# Patient Record
Sex: Female | Born: 1950 | Race: White | Hispanic: No | Marital: Married | Smoking: Never smoker
Health system: Southern US, Community
[De-identification: ages and names within clinical notes are randomized; demographics above are authoritative.]

## PROBLEM LIST (undated history)

## (undated) DIAGNOSIS — M278 Other specified diseases of jaws: Secondary | ICD-10-CM

## (undated) DIAGNOSIS — K219 Gastro-esophageal reflux disease without esophagitis: Secondary | ICD-10-CM

## (undated) DIAGNOSIS — R000 Tachycardia, unspecified: Secondary | ICD-10-CM

## (undated) DIAGNOSIS — M810 Age-related osteoporosis without current pathological fracture: Secondary | ICD-10-CM

## (undated) DIAGNOSIS — M19019 Primary osteoarthritis, unspecified shoulder: Secondary | ICD-10-CM

## (undated) DIAGNOSIS — M7542 Impingement syndrome of left shoulder: Secondary | ICD-10-CM

## (undated) DIAGNOSIS — I959 Hypotension, unspecified: Secondary | ICD-10-CM

## (undated) DIAGNOSIS — Z972 Presence of dental prosthetic device (complete) (partial): Secondary | ICD-10-CM

## (undated) HISTORY — DX: Age-related osteoporosis without current pathological fracture: M81.0

## (undated) HISTORY — PX: TOOTH EXTRACTION: SUR596

## (undated) HISTORY — PX: COLONOSCOPY: SHX174

## (undated) HISTORY — PX: ROTATOR CUFF REPAIR: SHX139

---

## 1998-05-14 ENCOUNTER — Emergency Department (HOSPITAL_COMMUNITY): Admission: EM | Admit: 1998-05-14 | Discharge: 1998-05-14 | Payer: Self-pay

## 1998-06-09 ENCOUNTER — Ambulatory Visit (HOSPITAL_COMMUNITY): Admission: RE | Admit: 1998-06-09 | Discharge: 1998-06-09 | Payer: Self-pay | Admitting: Family Medicine

## 2000-05-08 ENCOUNTER — Other Ambulatory Visit: Admission: RE | Admit: 2000-05-08 | Discharge: 2000-05-08 | Payer: Self-pay | Admitting: Obstetrics and Gynecology

## 2001-02-27 ENCOUNTER — Encounter (INDEPENDENT_AMBULATORY_CARE_PROVIDER_SITE_OTHER): Payer: Self-pay | Admitting: Specialist

## 2001-02-27 ENCOUNTER — Ambulatory Visit (HOSPITAL_BASED_OUTPATIENT_CLINIC_OR_DEPARTMENT_OTHER): Admission: RE | Admit: 2001-02-27 | Discharge: 2001-02-27 | Payer: Self-pay | Admitting: Plastic Surgery

## 2001-03-07 ENCOUNTER — Other Ambulatory Visit: Admission: RE | Admit: 2001-03-07 | Discharge: 2001-03-07 | Payer: Self-pay | Admitting: Obstetrics and Gynecology

## 2002-12-11 ENCOUNTER — Other Ambulatory Visit: Admission: RE | Admit: 2002-12-11 | Discharge: 2002-12-11 | Payer: Self-pay | Admitting: *Deleted

## 2004-03-25 ENCOUNTER — Other Ambulatory Visit: Admission: RE | Admit: 2004-03-25 | Discharge: 2004-03-25 | Payer: Self-pay | Admitting: Obstetrics and Gynecology

## 2005-03-31 ENCOUNTER — Other Ambulatory Visit: Admission: RE | Admit: 2005-03-31 | Discharge: 2005-03-31 | Payer: Self-pay | Admitting: Obstetrics and Gynecology

## 2006-04-10 ENCOUNTER — Other Ambulatory Visit: Admission: RE | Admit: 2006-04-10 | Discharge: 2006-04-10 | Payer: Self-pay | Admitting: Obstetrics and Gynecology

## 2010-03-02 ENCOUNTER — Emergency Department (HOSPITAL_COMMUNITY): Admission: EM | Admit: 2010-03-02 | Discharge: 2010-03-02 | Payer: Self-pay | Admitting: Emergency Medicine

## 2011-02-28 LAB — COMPREHENSIVE METABOLIC PANEL
ALT: 21 U/L (ref 0–35)
AST: 20 U/L (ref 0–37)
BUN: 18 mg/dL (ref 6–23)
CO2: 30 mEq/L (ref 19–32)
Chloride: 102 mEq/L (ref 96–112)
GFR calc Af Amer: >60 mL/min (ref >60)
GFR calc non Af Amer: >60 mL/min (ref >60)
Potassium: 3.5 mEq/L (ref 3.5–5.1)
Sodium: 136 mEq/L (ref 135–145)

## 2011-02-28 LAB — CBC
HCT: 41.3 % (ref 36.0–46.0)
MCV: 93.1 fL (ref 78.0–100.0)
RDW: 13.1 % (ref 11.5–15.5)

## 2011-02-28 LAB — DIFFERENTIAL
Basophils Absolute: 0.1 10*3/uL (ref 0.0–0.1)
Basophils Relative: 1 % (ref 0–1)
Eosinophils Absolute: 0.1 10*3/uL (ref 0.0–0.7)
Lymphs Abs: 1.1 10*3/uL (ref 0.7–4.0)
Monocytes Absolute: 0.4 10*3/uL (ref 0.1–1.0)
Monocytes Relative: 7 % (ref 3–12)
Neutro Abs: 3.5 10*3/uL (ref 1.7–7.7)
Neutrophils Relative %: 69 % (ref 43–77)

## 2011-02-28 LAB — POCT PREGNANCY, URINE: Preg Test, Ur: NEGATIVE

## 2011-02-28 LAB — POCT CARDIAC MARKERS: Troponin i, poc: <0.05 ng/mL (ref 0.00–0.09)

## 2011-04-22 NOTE — Op Note (Signed)
[place]  Patient:    Katelyn Bradshaw, Katelyn Bradshaw                     MRN: [id number] Proc. Date: 02/27/01 Adm. Date:  [id number] Attending:  Loura Halt Ii                           Operative Report  PREOPERATIVE DIAGNOSIS:  Recurrent 5 mm dysplastic nevus, left lower eyelid.  POSTOPERATIVE DIAGNOSIS:  Recurrent 5 mm dysplastic nevus, left lower eyelid.  OPERATION PERFORMED:  Excision of recurrent dysplastic nevus, left lower eyelid.  SURGEON:  Alfredia Ferguson, M.D.  ANESTHESIA:  2% Xylocaine with 1:100,000 epinephrine.  INDICATIONS FOR PROCEDURE:  The patient is a 60 year old woman who previously has a dysplastic nevus removed from her left lower eyelid.  Over the last couple of years, there was recurrent pigmentation within the scar of the previous excision site.  Recommendation by the dermatologist is this pigmentation be removed.  The patient understands she will be trading what she has for a permanent potentially unsightly scar.  She understands the risk of ectropion.  In spite of that she wishes to proceed with the surgery.  DESCRIPTION OF PROCEDURE:  Skin markers were placed in elliptical fashion around the lesion with approximately 2 mm margins.  The local anesthesia was infiltrated and the area was prepped and draped in a sterile fashion.  A circular excision of the lesion was first carried out marking the superior and lateral margin.  The circular defect was then undermined for a distance of 3 to 4 mm in all directions.  A temporary suture was placed uniting the edges to ensure that there is no ectropion created.  Once I was certain that the eyelid was not being pulled down, I converted the circle to an ellipse.  The medial and lateral recut margin was submitted.  The ellipse was further undermined. Hemostasis was accomplished using cautery.  The wound was closed by approximating the dermis using interrupted 6-0 Vicryl suture.   Skin was united using interrupted 6-0 nylon suture.  The patient tolerated the procedure well with minimal blood loss.  A light dressing was applied.  No ectropion was present at the conclusion of the procedure.  The patient tolerated the procedure well and was discharged home in satisfactory condition. DD:  02/27/01 TD:  02/27/01 Job: [id number]

## 2012-02-27 ENCOUNTER — Encounter (INDEPENDENT_AMBULATORY_CARE_PROVIDER_SITE_OTHER): Payer: Managed Care, Other (non HMO) | Admitting: Obstetrics and Gynecology

## 2012-02-27 ENCOUNTER — Encounter (INDEPENDENT_AMBULATORY_CARE_PROVIDER_SITE_OTHER): Payer: Managed Care, Other (non HMO)

## 2012-02-27 DIAGNOSIS — N8500 Endometrial hyperplasia, unspecified: Secondary | ICD-10-CM

## 2012-02-27 DIAGNOSIS — N390 Urinary tract infection, site not specified: Secondary | ICD-10-CM

## 2012-02-27 DIAGNOSIS — N949 Unspecified condition associated with female genital organs and menstrual cycle: Secondary | ICD-10-CM

## 2012-02-28 ENCOUNTER — Encounter: Payer: Self-pay | Admitting: Obstetrics and Gynecology

## 2012-03-09 ENCOUNTER — Other Ambulatory Visit: Payer: Self-pay | Admitting: Obstetrics and Gynecology

## 2012-03-09 DIAGNOSIS — R102 Pelvic and perineal pain: Secondary | ICD-10-CM

## 2012-03-09 DIAGNOSIS — N852 Hypertrophy of uterus: Secondary | ICD-10-CM

## 2012-04-27 ENCOUNTER — Ambulatory Visit: Payer: Self-pay | Admitting: Obstetrics and Gynecology

## 2012-05-01 ENCOUNTER — Other Ambulatory Visit: Payer: Managed Care, Other (non HMO)

## 2012-05-25 ENCOUNTER — Encounter: Payer: Self-pay | Admitting: Obstetrics and Gynecology

## 2012-06-01 ENCOUNTER — Ambulatory Visit (INDEPENDENT_AMBULATORY_CARE_PROVIDER_SITE_OTHER): Payer: Managed Care, Other (non HMO)

## 2012-06-01 ENCOUNTER — Encounter: Payer: Self-pay | Admitting: Obstetrics and Gynecology

## 2012-06-01 ENCOUNTER — Ambulatory Visit (INDEPENDENT_AMBULATORY_CARE_PROVIDER_SITE_OTHER): Payer: Managed Care, Other (non HMO) | Admitting: Obstetrics and Gynecology

## 2012-06-01 ENCOUNTER — Other Ambulatory Visit: Payer: Self-pay | Admitting: Obstetrics and Gynecology

## 2012-06-01 VITALS — BP 100/58 | HR 66 | Resp 14 | Ht 65.5 in | Wt 94.0 lb

## 2012-06-01 DIAGNOSIS — N852 Hypertrophy of uterus: Secondary | ICD-10-CM

## 2012-06-01 DIAGNOSIS — R102 Pelvic and perineal pain: Secondary | ICD-10-CM

## 2012-06-01 DIAGNOSIS — E559 Vitamin D deficiency, unspecified: Secondary | ICD-10-CM | POA: Insufficient documentation

## 2012-06-01 DIAGNOSIS — Z01419 Encounter for gynecological examination (general) (routine) without abnormal findings: Secondary | ICD-10-CM

## 2012-06-01 DIAGNOSIS — M810 Age-related osteoporosis without current pathological fracture: Secondary | ICD-10-CM | POA: Insufficient documentation

## 2012-06-01 DIAGNOSIS — Z124 Encounter for screening for malignant neoplasm of cervix: Secondary | ICD-10-CM

## 2012-06-01 NOTE — Progress Notes (Signed)
Regular Periods: no Mammogram: no  Monthly Breast Ex.: yes Exercise: yes  Tetanus < 10 years: no Seatbelts: yes  NI. Bladder Functn.: yes Abuse at home: no  Daily BM's: yes Stressful Work: yes  Healthy Diet: yes Sigmoid-Colonoscopy: 02/2012 "1 polyp Return in 5 yrs."  Calcium: yes Medical problems this year: N/A   LAST PAP:04/2011 "WNL"  Contraception: None  Mammogram:  N/A pt states she stopped getting mammograms pt states [place] knows why.   PCP: Dr.Stoneking  PMH: No changes  FMH: No Changes  Last Bone Scan: per pt years ago and pt states she will not be getting another one.

## 2012-06-01 NOTE — Progress Notes (Signed)
Subjective:    Katelyn Bradshaw is a 61 y.o. female G0P0 who presents for annual exam. The patient previously had a thickened endometrium in March 2013 (0.66 cm).  Endometrial biopsy at that time was benign and patient declined a SHG and wanted to repeat U/S today instead.  Has changed her progesterone cream application site to a more adipose laden area for better absorption.    Current Hormone Regimen: Triest (6:2:2:) 1mg  + Progesterone 15% + DHEA 10 mg + Testosterone 1mg /29mL Cream  patient uses 1/2 mL bid  Last Pap: was normal  2012 Last mammogram: was normal  patient has elected not to continue mammogram Last DEXA scan :  2005  Osteoporosis   Review of Systems Gastrointestinal:No change in bowel habits, no abdominal pain, no rectal bleeding Genitourinary:negative for abnormal vaginal bleeding,  dysuria, frequency, hematuria, nocturia and urinary incontinence    Objective:     BP 100/58  Pulse 66  Resp 14  Ht 5' 5.5" (1.664 m)  Wt 94 lb (42.638 kg)  BMI 15.40 kg/m2  Wt Readings from Last 1 Encounters:  06/01/12 94 lb (42.638 kg)   Body mass index is 15.40 kg/(m^2). General Appearance:  Well nourished in no acute distress HEENT: Grossly normal Neck / Thyroid: Supple, no masses, nodes or enlargement Lungs: Clear to auscultation bilaterally Back: No CVA tenderness Breast Exam: No masses or nodes.No dimpling, nipple retraction or discharge. Cardiovascular: Regular rate and rhythm.  Gastrointestinal: Soft, non-tender, no masses or organomegaly Pelvic Exam: EGBUS-normal, vagina-pink mucosa without lesions, cervix-no tenderness or lesions, uterus-appears normal size, shape and consistency, adnexae-no masses or tenderness Rectovaginal: Normal sphincter tone and  no masses  Lymphatic Exam: Non-palpable nodes in neck, clavicular, axillary, or inguinal regions Skin: No rash or abnormalities Extremities: no clubbing cyanosis or edema Neurologic: Grossly normal Psychiatric: Alert and  oriented x 3   U/S endometrium= 0.365 cm  Assessment:   Routine GYN Exam Normal Endometrial Thickness   Plan:  Saliva kit given to retest hormones in September unless problems develop before then  PAP sent  RTO 1 year or prn   Follow-up:  1 year or prn   Tirza Senteno,ELMIRAPA-C

## 2012-06-04 LAB — PAP IG W/ RFLX HPV ASCU

## 2012-06-21 ENCOUNTER — Ambulatory Visit (INDEPENDENT_AMBULATORY_CARE_PROVIDER_SITE_OTHER): Payer: Managed Care, Other (non HMO) | Admitting: Obstetrics and Gynecology

## 2012-06-21 ENCOUNTER — Encounter: Payer: Self-pay | Admitting: Obstetrics and Gynecology

## 2012-06-21 VITALS — BP 100/70 | Temp 98.0°F | Wt 91.0 lb

## 2012-06-21 DIAGNOSIS — R102 Pelvic and perineal pain: Secondary | ICD-10-CM

## 2012-06-21 DIAGNOSIS — N949 Unspecified condition associated with female genital organs and menstrual cycle: Secondary | ICD-10-CM

## 2012-06-21 LAB — POCT URINALYSIS DIPSTICK
Bilirubin, UA: NEGATIVE
Blood, UA: NEGATIVE
Glucose, UA: NEGATIVE
Ketones, UA: NEGATIVE
Leukocytes, UA: NEGATIVE
Nitrite, UA: NEGATIVE
Protein, UA: NEGATIVE
Spec Grav, UA: 1.015
Urobilinogen, UA: NEGATIVE
pH, UA: 5

## 2012-06-21 NOTE — Progress Notes (Signed)
61 YO with history of pelvic discomfort for years complains of discomfort in groin area that comes and goes and is a burning sensation.  May go for weeks without it then burining  returns and will remain up to 3 days.  Denies any aggravating factors but it is improved with acidophilus douche.  Patient admits to using  Estrace as directed.  Admits to occasional urinary urgency (primarily in a.m. but not related to the burning sensation-has no other urinary symptoms.  Denies changes in bowel movements.  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  O: Abdomen: soft, non-tender       Pelvic: EGBUS-wnl, vagina-normal rugae, cerivix-no lesions, uterus-normal size, adnexae-no masses or tenderness       Groin: no palpable tenderness or masses  A:   Discussed with Dr. Estanislado Pandy, the patient's symptoms, she  advised to refer to  PCP, for non-gyn related discomfort        If non-gyn evaluation does not render a cause of discomfort, diagnostic laparoscopy would be a possible recourse             RTO-AEx or prn

## 2012-06-21 NOTE — Progress Notes (Signed)
Vag. Discharge:no Odor:no Fever:no Irreg.Periods:no Dyspareunia:no Dysuria:no Frequency:no Urgency:yes Hematuria:no Kidney stones:no Constipation:no Diarrhea:no Rectal Bleeding: no Vomiting:no Nausea:no Pregnant:no Fibroids:no Endometriosis:no Hx of Ovarian Cyst:no Hx IUD:no Hx STD-PID:no Appendectomy:no Gall Bladder Dz:no   PT STATES THAT [facility] WILL NO LONGER EXCEPT HER INS. IN 8/13 AND SHE WANT TO DISCUSS

## 2012-07-06 ENCOUNTER — Encounter: Payer: Self-pay | Admitting: Obstetrics and Gynecology

## 2012-07-06 ENCOUNTER — Other Ambulatory Visit: Payer: Self-pay | Admitting: Obstetrics and Gynecology

## 2012-07-06 MED ORDER — ESTRADIOL 0.1 MG/GM VA CREA: TOPICAL_CREAM | VAGINAL | Status: DC

## 2012-07-06 NOTE — Telephone Encounter (Signed)
Pt want estrace.rx Please call [facility] so that pt can have rx

## 2012-07-06 NOTE — Progress Notes (Signed)
Fax received for RF Estrace.  Per EP note 05/2012, no documentation pt is currently using. RF denied.  TC tot pt LM to return call.

## 2012-07-06 NOTE — Telephone Encounter (Signed)
Linda/signed

## 2012-09-04 DIAGNOSIS — M19019 Primary osteoarthritis, unspecified shoulder: Secondary | ICD-10-CM

## 2012-09-04 DIAGNOSIS — M7542 Impingement syndrome of left shoulder: Secondary | ICD-10-CM

## 2012-09-04 DIAGNOSIS — M7540 Impingement syndrome of unspecified shoulder: Secondary | ICD-10-CM | POA: Insufficient documentation

## 2012-09-04 HISTORY — DX: Impingement syndrome of left shoulder: M75.42

## 2012-09-04 HISTORY — DX: Primary osteoarthritis, unspecified shoulder: M19.019

## 2012-09-20 ENCOUNTER — Other Ambulatory Visit: Payer: Self-pay | Admitting: Orthopedic Surgery

## 2012-09-21 ENCOUNTER — Encounter (HOSPITAL_BASED_OUTPATIENT_CLINIC_OR_DEPARTMENT_OTHER): Payer: Self-pay | Admitting: *Deleted

## 2012-09-27 ENCOUNTER — Ambulatory Visit (HOSPITAL_BASED_OUTPATIENT_CLINIC_OR_DEPARTMENT_OTHER): Payer: Managed Care, Other (non HMO) | Admitting: Anesthesiology

## 2012-09-27 ENCOUNTER — Ambulatory Visit (HOSPITAL_BASED_OUTPATIENT_CLINIC_OR_DEPARTMENT_OTHER)
Admission: RE | Admit: 2012-09-27 | Discharge: 2012-09-27 | Disposition: A | Discharge status: Home / Self Care | Payer: Managed Care, Other (non HMO) | Source: Ambulatory Visit | Attending: Orthopedic Surgery | Admitting: Orthopedic Surgery

## 2012-09-27 ENCOUNTER — Encounter (HOSPITAL_BASED_OUTPATIENT_CLINIC_OR_DEPARTMENT_OTHER)
Admission: RE | Disposition: A | Discharge status: Home / Self Care | Payer: Self-pay | Source: Ambulatory Visit | Attending: Orthopedic Surgery

## 2012-09-27 ENCOUNTER — Encounter (HOSPITAL_BASED_OUTPATIENT_CLINIC_OR_DEPARTMENT_OTHER): Payer: Self-pay | Admitting: Anesthesiology

## 2012-09-27 ENCOUNTER — Encounter (HOSPITAL_BASED_OUTPATIENT_CLINIC_OR_DEPARTMENT_OTHER): Payer: Self-pay

## 2012-09-27 DIAGNOSIS — D2110 Benign neoplasm of connective and other soft tissue of unspecified upper limb, including shoulder: Secondary | ICD-10-CM | POA: Insufficient documentation

## 2012-09-27 DIAGNOSIS — M25819 Other specified joint disorders, unspecified shoulder: Secondary | ICD-10-CM | POA: Insufficient documentation

## 2012-09-27 DIAGNOSIS — Z5333 Arthroscopic surgical procedure converted to open procedure: Secondary | ICD-10-CM | POA: Insufficient documentation

## 2012-09-27 DIAGNOSIS — M75120 Complete rotator cuff tear or rupture of unspecified shoulder, not specified as traumatic: Secondary | ICD-10-CM | POA: Insufficient documentation

## 2012-09-27 HISTORY — DX: Other specified diseases of jaws: M27.8

## 2012-09-27 HISTORY — DX: Presence of dental prosthetic device (complete) (partial): Z97.2

## 2012-09-27 HISTORY — DX: Primary osteoarthritis, unspecified shoulder: M19.019

## 2012-09-27 HISTORY — DX: Impingement syndrome of left shoulder: M75.42

## 2012-09-27 LAB — POCT HEMOGLOBIN-HEMACUE: Hemoglobin: 10.7 g/dL — ABNORMAL LOW (ref 12.0–15.0)

## 2012-09-27 SURGERY — SHOULDER ARTHROSCOPY WITH ROTATOR CUFF REPAIR AND SUBACROMIAL DECOMPRESSION
Anesthesia: General | Site: Shoulder | Laterality: Left | Wound class: Clean

## 2012-09-27 MED ORDER — HYDROMORPHONE HCL 2 MG PO TABS: ORAL_TABLET | ORAL | Status: DC

## 2012-09-27 MED ORDER — LACTATED RINGERS IV SOLN
INTRAVENOUS | Status: DC
  Administered 2012-09-27 (×2): via INTRAVENOUS

## 2012-09-27 MED ORDER — FENTANYL CITRATE 0.05 MG/ML IJ SOLN
100.0000 ug | Freq: Once | INTRAMUSCULAR | Status: AC
  Administered 2012-09-27: 100 ug via INTRAVENOUS

## 2012-09-27 MED ORDER — EPHEDRINE SULFATE 50 MG/ML IJ SOLN
INTRAMUSCULAR | Status: DC | PRN
  Administered 2012-09-27: 10 mg via INTRAVENOUS

## 2012-09-27 MED ORDER — DEXAMETHASONE SODIUM PHOSPHATE 4 MG/ML IJ SOLN
INTRAMUSCULAR | Status: DC | PRN
  Administered 2012-09-27: 5 mg via INTRAVENOUS

## 2012-09-27 MED ORDER — CHLORHEXIDINE GLUCONATE 4 % EX LIQD: 60.0000 mL | Freq: Once | CUTANEOUS | Status: DC

## 2012-09-27 MED ORDER — ACETAMINOPHEN 10 MG/ML IV SOLN: 1000.0000 mg | Freq: Once | INTRAVENOUS | Status: DC | PRN

## 2012-09-27 MED ORDER — ONDANSETRON HCL 4 MG/2ML IJ SOLN: 4.0000 mg | Freq: Once | INTRAMUSCULAR | Status: DC | PRN

## 2012-09-27 MED ORDER — VANCOMYCIN HCL IN DEXTROSE 1-5 GM/200ML-% IV SOLN: 1000.0000 mg | INTRAVENOUS | Status: DC

## 2012-09-27 MED ORDER — ONDANSETRON HCL 4 MG/2ML IJ SOLN
INTRAMUSCULAR | Status: DC | PRN
  Administered 2012-09-27: 4 mg via INTRAVENOUS

## 2012-09-27 MED ORDER — MIDAZOLAM HCL 5 MG/5ML IJ SOLN
INTRAMUSCULAR | Status: DC | PRN
  Administered 2012-09-27: 2 mg via INTRAVENOUS

## 2012-09-27 MED ORDER — MIDAZOLAM HCL 5 MG/ML IJ SOLN
2.0000 mg | Freq: Once | INTRAMUSCULAR | Status: AC
  Administered 2012-09-27: 2 mg via INTRAVENOUS

## 2012-09-27 MED ORDER — VANCOMYCIN HCL 1000 MG IV SOLR
750.0000 mg | INTRAVENOUS | Status: AC
  Administered 2012-09-27: 750 mg via INTRAVENOUS

## 2012-09-27 MED ORDER — HYDROMORPHONE HCL PF 1 MG/ML IJ SOLN
0.2500 mg | INTRAMUSCULAR | Status: DC | PRN
  Administered 2012-09-27: 0.25 mg via INTRAVENOUS

## 2012-09-27 MED ORDER — SODIUM CHLORIDE 0.9 % IR SOLN
Status: DC | PRN
  Administered 2012-09-27: 6000 mL

## 2012-09-27 MED ORDER — SUCCINYLCHOLINE CHLORIDE 20 MG/ML IJ SOLN
INTRAMUSCULAR | Status: DC | PRN
  Administered 2012-09-27: 100 mg via INTRAVENOUS

## 2012-09-27 MED ORDER — PROPOFOL 10 MG/ML IV BOLUS
INTRAVENOUS | Status: DC | PRN
  Administered 2012-09-27: 130 mg via INTRAVENOUS

## 2012-09-27 SURGICAL SUPPLY — 79 items
BANDAGE ADHESIVE 1X3 (GAUZE/BANDAGES/DRESSINGS) IMPLANT
BLADE AVERAGE 25X9 (BLADE) IMPLANT
BLADE CUTTER MENIS 5.5 (BLADE) IMPLANT
BLADE SURG 15 STRL LF DISP TIS (BLADE) ×2 IMPLANT
BLADE SURG 15 STRL SS (BLADE) ×2
BUR EGG 3PK/BX (BURR) IMPLANT
BUR OVAL 6.0 (BURR) ×2 IMPLANT
CANISTER OMNI JUG 16 LITER (MISCELLANEOUS) ×4 IMPLANT
CANISTER SUCTION 2500CC (MISCELLANEOUS) IMPLANT
CANNULA SHOULDER 7CM (CANNULA) IMPLANT
CANNULA TWIST IN 8.25X7CM (CANNULA) IMPLANT
CLEANER CAUTERY TIP 5X5 PAD (MISCELLANEOUS) IMPLANT
CLOTH BEACON ORANGE TIMEOUT ST (SAFETY) ×2 IMPLANT
CUTTER MENISCUS  4.2MM (BLADE) ×1
CUTTER MENISCUS 4.2MM (BLADE) ×1 IMPLANT
DECANTER SPIKE VIAL GLASS SM (MISCELLANEOUS) IMPLANT
DRAPE INCISE IOBAN 66X45 STRL (DRAPES) ×2 IMPLANT
DRAPE STERI 35X30 U-POUCH (DRAPES) ×2 IMPLANT
DRAPE SURG 17X23 STRL (DRAPES) ×2 IMPLANT
DRAPE U-SHAPE 47X51 STRL (DRAPES) ×2 IMPLANT
DRAPE U-SHAPE 76X120 STRL (DRAPES) ×4 IMPLANT
DRSG PAD ABDOMINAL 8X10 ST (GAUZE/BANDAGES/DRESSINGS) ×2 IMPLANT
DURAPREP 26ML APPLICATOR (WOUND CARE) ×2 IMPLANT
ELECT REM PT RETURN 9FT ADLT (ELECTROSURGICAL) ×2
ELECTRODE REM PT RTRN 9FT ADLT (ELECTROSURGICAL) ×1 IMPLANT
GLOVE BIO SURGEON STRL SZ 6.5 (GLOVE) ×2 IMPLANT
GLOVE BIO SURGEON STRL SZ7.5 (GLOVE) ×2 IMPLANT
GLOVE BIOGEL M STRL SZ7.5 (GLOVE) ×2 IMPLANT
GLOVE BIOGEL PI IND STRL 7.0 (GLOVE) ×1 IMPLANT
GLOVE BIOGEL PI IND STRL 8 (GLOVE) ×3 IMPLANT
GLOVE BIOGEL PI INDICATOR 7.0 (GLOVE) ×1
GLOVE BIOGEL PI INDICATOR 8 (GLOVE) ×3
GLOVE ORTHO TXT STRL SZ7.5 (GLOVE) ×2 IMPLANT
GOWN BRE IMP PREV XXLGXLNG (GOWN DISPOSABLE) ×4 IMPLANT
GOWN PREVENTION PLUS XLARGE (GOWN DISPOSABLE) ×2 IMPLANT
GOWN STRL REIN 2XL XLG LVL4 (GOWN DISPOSABLE) ×2 IMPLANT
NDL SUT 6 .5 CRC .975X.05 MAYO (NEEDLE) IMPLANT
NEEDLE MAYO TAPER (NEEDLE)
NEEDLE MINI RC 24MM (NEEDLE) IMPLANT
NEEDLE SCORPION (NEEDLE) IMPLANT
PACK ARTHROSCOPY DSU (CUSTOM PROCEDURE TRAY) ×2 IMPLANT
PACK BASIN DAY SURGERY FS (CUSTOM PROCEDURE TRAY) ×2 IMPLANT
PAD CLEANER CAUTERY TIP 5X5 (MISCELLANEOUS)
PASSER SUT SWANSON 36MM LOOP (INSTRUMENTS) IMPLANT
PENCIL BUTTON HOLSTER BLD 10FT (ELECTRODE) ×2 IMPLANT
SLEEVE SCD COMPRESS KNEE MED (MISCELLANEOUS) ×2 IMPLANT
SLING ARM FOAM STRAP LRG (SOFTGOODS) ×2 IMPLANT
SLING ARM FOAM STRAP MED (SOFTGOODS) IMPLANT
SPONGE GAUZE 4X4 12PLY (GAUZE/BANDAGES/DRESSINGS) ×2 IMPLANT
SPONGE LAP 4X18 X RAY DECT (DISPOSABLE) ×4 IMPLANT
STRIP CLOSURE SKIN 1/2X4 (GAUZE/BANDAGES/DRESSINGS) ×2 IMPLANT
SUCTION FRAZIER TIP 10 FR DISP (SUCTIONS) IMPLANT
SUT ETHIBOND 2 OS 4 DA (SUTURE) IMPLANT
SUT ETHILON 4 0 PS 2 18 (SUTURE) IMPLANT
SUT FIBERWIRE #2 38 T-5 BLUE (SUTURE)
SUT FIBERWIRE 3-0 18 TAPR NDL (SUTURE)
SUT PROLENE 1 CT (SUTURE) IMPLANT
SUT PROLENE 3 0 PS 2 (SUTURE) ×2 IMPLANT
SUT TIGER TAPE 7 IN WHITE (SUTURE) IMPLANT
SUT VIC AB 0 CT1 27 (SUTURE)
SUT VIC AB 0 CT1 27XBRD ANBCTR (SUTURE) IMPLANT
SUT VIC AB 0 SH 27 (SUTURE) ×2 IMPLANT
SUT VIC AB 2-0 SH 27 (SUTURE) ×1
SUT VIC AB 2-0 SH 27XBRD (SUTURE) ×1 IMPLANT
SUT VIC AB 3-0 SH 27 (SUTURE)
SUT VIC AB 3-0 SH 27X BRD (SUTURE) IMPLANT
SUT VIC AB 3-0 X1 27 (SUTURE) IMPLANT
SUTURE FIBERWR #2 38 T-5 BLUE (SUTURE) IMPLANT
SUTURE FIBERWR 3-0 18 TAPR NDL (SUTURE) IMPLANT
SYR 3ML 23GX1 SAFETY (SYRINGE) IMPLANT
SYR BULB 3OZ (MISCELLANEOUS) IMPLANT
TAPE FIBER 2MM 7IN #2 BLUE (SUTURE) IMPLANT
TAPE PAPER 3X10 WHT MICROPORE (GAUZE/BANDAGES/DRESSINGS) ×2 IMPLANT
TOWEL OR 17X24 6PK STRL BLUE (TOWEL DISPOSABLE) ×2 IMPLANT
TUBE CONNECTING 20X1/4 (TUBING) ×4 IMPLANT
TUBING ARTHROSCOPY IRRIG 16FT (MISCELLANEOUS) IMPLANT
WAND STAR VAC 90 (SURGICAL WAND) ×2 IMPLANT
WATER STERILE IRR 1000ML POUR (IV SOLUTION) ×2 IMPLANT
YANKAUER SUCT BULB TIP NO VENT (SUCTIONS) IMPLANT

## 2012-09-27 NOTE — Anesthesia Postprocedure Evaluation (Signed)
  Anesthesia Post-op Note  Patient: Katelyn Bradshaw  Procedure(s) Performed: Procedure(s) (LRB) with comments: SHOULDER ARTHROSCOPY WITH ROTATOR CUFF REPAIR AND SUBACROMIAL DECOMPRESSION (Left)  Patient Location: PACU  Anesthesia Type: General and GA combined with regional for post-op pain  Level of Consciousness: awake, alert  and oriented  Airway and Oxygen Therapy: Patient Spontanous Breathing  Post-op Pain: mild  Post-op Assessment: Post-op Vital signs reviewed and Patient's Cardiovascular Status Stable  Post-op Vital Signs: stable  Complications: No apparent anesthesia complications

## 2012-09-27 NOTE — Anesthesia Preprocedure Evaluation (Signed)
Anesthesia Evaluation  Patient identified by MRN, date of birth, ID band Patient awake    Reviewed: Allergy & Precautions, H&P , NPO status , Patient's Chart, lab work & pertinent test results  Airway Mallampati: I      Dental  (+) Dental Advisory Given and Teeth Intact   Pulmonary  breath sounds clear to auscultation        Cardiovascular Rhythm:Regular Rate:Normal     Neuro/Psych    GI/Hepatic   Endo/Other    Renal/GU      Musculoskeletal   Abdominal   Peds  Hematology   Anesthesia Other Findings   Reproductive/Obstetrics                           Anesthesia Physical Anesthesia Plan  ASA: I  Anesthesia Plan: General   Post-op Pain Management:    Induction:   Airway Management Planned: Oral ETT  Additional Equipment:   Intra-op Plan:   Post-operative Plan: Extubation in OR  Informed Consent: I have reviewed the patients History and Physical, chart, labs and discussed the procedure including the risks, benefits and alternatives for the proposed anesthesia with the patient or authorized representative who has indicated his/her understanding and acceptance.   Dental advisory given  Plan Discussed with: CRNA and Surgeon  Anesthesia Plan Comments:         Anesthesia Quick Evaluation

## 2012-09-27 NOTE — Progress Notes (Signed)
Assisted Dr. Joslin with left, ultrasound guided, interscalene  block. Side rails up, monitors on throughout procedure. See vital signs in flow sheet. Tolerated Procedure well. 

## 2012-09-27 NOTE — H&P (Signed)
Katelyn Bradshaw is an 61 y.o. female.   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  Both she and her husband are retired.  She has been experiencing night pain, slight loss of range of motion, marked pain with overhead activity, anterior shoulder tenderness on the left.  She is working part [employer]olic[employer], but feels she likely strained her shoulder doing a workout DVD.  She has been trying to maintain her level of fitness. She has been using Advil, Tylenol, and Aleve.  She has also been applying heat.  She has avoided weightlifting.     Past Medical History  Diagnosis Date  . Osteoporosis   . Impingement syndrome, shoulder, left 09/2012  . 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 Maxillary bone loss     has lower permanent retainer and bonding material over the retainer  . Dental bridge present     lower    Past Surgical History  Procedure Date  . Tooth extraction     History reviewed. No pertinent family history. Social History:  reports that she has never smoked. She has never used smokeless tobacco. She reports that she does not drink alcohol or use illicit drugs.  Allergies:  Allergies  Allergen Reactions  . Doxycycline Hives  . Levaquin (Levofloxacin In D5w) Hives  . Penicillins Hives  . Adhesive (Tape) Other (See Comments)    SKIN IRRITATION  . Codeine Nausea Only    Medications Prior to Admission  Medication Sig Dispense Refill  . clonazePAM (KLONOPIN) 1 MG tablet Take 1 mg by mouth 2 (two) times daily as needed.       Marland Kitchen estradiol (ESTRACE VAGINAL) 0.1 MG/GM vaginal cream 1 g pv with applicator or finger qod x 10 days then twice weekly  42.5 g  11  . Multiple Vitamin (MULTIVITAMIN) tablet Take 1 tablet by mouth daily.      Marland Kitchen zolpidem (AMBIEN) 10 MG tablet Take 1 tablet by mouth as needed.        No results found for this or any previous visit (from the past 48 hour(s)).  No  results found.   Pertinent items are noted in HPI.  Height 5' 5.5" (1.664 m), weight 45.36 kg (100 lb).  General appearance: alert Head: Normocephalic, without obvious abnormality Neck: supple, symmetrical, trachea midline Resp: clear to auscultation bilaterally Cardio: regular rate and rhythm GI: normal findings: bowel sounds normal Extremities: Her shoulder range of motion is full with elevation 175 right equal to left, external rotation 90, internal rotation to at least T-8 right equal to left. She does not have a capsular contracture.  She is profoundly tender on palpation over her left biceps.  There is fluid palpable in the bicipital tendon sheath.  There is pain with cross-torso adduction, pain with rapid abduction.   Plain films of her shoulder demonstrate mild AC arthrosis with some prominence of the anterior acromion consistent with a type II acromion.  MRI at [facility] on 08-16-12 . I carefully examined the films. Katelyn Bradshaw has a prominent anterior lateral acromion and a thickened coracoacromial ligament. There is a small area of increased T2 signal at the anterior supraspinatus. This correlates thoroughly with her physical exam which reveals a marked click crepitation and pop with cross torso adduction in the manner of Hawkins maneuver.  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   DASNOIT,[place] Ramone J 09/27/2012, 9:48 AM     H&P documentation: 09/27/2012  -History and Physical Reviewed  -Patient has been re-examined  -No change in the plan of care  Wyn Forster, MD

## 2012-09-27 NOTE — Brief Op Note (Signed)
09/27/2012  1:47 PM  PATIENT:  Katelyn Bradshaw  61 y.o. female  PRE-OPERATIVE DIAGNOSIS:  LEFT SHOULDER IMPINGEMENT WITH ACRMIOCLAVICULAR ARTHROSIS   POST-OPERATIVE DIAGNOSIS:  IMPINGEMENT WITH ACRMIOCLAVICULAR ARTHROSIS , ROTATOR CUFF TEAR  PROCEDURE:  Procedure(s) (LRB) with comments: SHOULDER ARTHROSCOPY WITH ROTATOR CUFF REPAIR AND SUBACROMIAL DECOMPRESSION (Left)  SURGEON:  Surgeon(s) and Role:    * Wyn Forster., MD - Primary  PHYSICIAN ASSISTANT:   ASSISTANTS:Shalayne Leach Dasnoit,P.A-C   ANESTHESIA:   general  EBL:  Total I/O In: 1200 [I.V.:1200] Out: -   BLOOD ADMINISTERED:none  DRAINS: none   LOCAL MEDICATIONS USED:  ropivicaine plexus block preop  SPECIMEN:  No Specimen  DISPOSITION OF SPECIMEN:  N/A  COUNTS:  YES  TOURNIQUET:  * No tourniquets in log *  DICTATION: .Other Dictation: Dictation Number [id number]  PLAN OF CARE: Discharge to home after PACU  PATIENT DISPOSITION:  PACU - hemodynamically stable.

## 2012-09-27 NOTE — Transfer of Care (Signed)
Immediate Anesthesia Transfer of Care Note  Patient: Katelyn Bradshaw  Procedure(s) Performed: Procedure(s) (LRB) with comments: SHOULDER ARTHROSCOPY WITH ROTATOR CUFF REPAIR AND SUBACROMIAL DECOMPRESSION (Left)  Patient Location: PACU  Anesthesia Type: GA combined with regional for post-op pain  Level of Consciousness: sedated  Airway & Oxygen Therapy: Patient Spontanous Breathing and Patient connected to face mask oxygen  Post-op Assessment: Report given to PACU RN and Post -op Vital signs reviewed and stable  Post vital signs: Reviewed and stable  Complications: No apparent anesthesia complications

## 2012-09-27 NOTE — Anesthesia Procedure Notes (Addendum)
Anesthesia Regional Block:  Interscalene brachial plexus block  Pre-Anesthetic Checklist: ,, timeout performed, Correct Patient, Correct Site, Correct Laterality, Correct Procedure, Correct Position, site marked, Risks and benefits discussed,  Surgical consent,  Pre-op evaluation,  At surgeon's request and post-op pain management  Laterality: Left  Prep: chloraprep       Needles:  Injection technique: Single-shot  Needle Type: Echogenic Stimulator Needle      Needle Gauge: 22 and 22 G    Additional Needles:  Procedures: ultrasound guided and nerve stimulator Interscalene brachial plexus block Narrative:   Performed by: Personally   Additional Notes: 30 cc 0.5% Naropin injected in 5 CC increments  Kipp Brood, MD   Performed by: Gar Gibbon    Procedure Name: Intubation Date/Time: 09/27/2012 12:28 PM Performed by: Gar Gibbon Pre-anesthesia Checklist: Patient identified, Emergency Drugs available, Suction available and Patient being monitored Patient Re-evaluated:Patient Re-evaluated prior to inductionOxygen Delivery Method: Circle System Utilized Preoxygenation: Pre-oxygenation with 100% oxygen Intubation Type: IV induction Ventilation: Mask ventilation without difficulty Laryngoscope Size: Mac and 3 Grade View: Grade III Tube type: Oral Tube size: 7.0 mm Number of attempts: 1 Airway Equipment and Method: stylet and oral airway Placement Confirmation: ETT inserted through vocal cords under direct vision,  positive ETCO2 and breath sounds checked- equal and bilateral Secured at: 21 cm Tube secured with: Tape Dental Injury: Teeth and Oropharynx as per pre-operative assessment

## 2012-09-28 NOTE — Op Note (Signed)
NAME:  Katelyn Bradshaw, Katelyn Bradshaw NO.:  [id number]  MEDICAL RECORD NO.:  [id number]  LOCATION:                                 FACILITY:  PHYSICIAN:  Katy Fitch. Jameir Ake, M.D. DATE OF BIRTH:  18-Oct-1951  DATE OF PROCEDURE:  09/27/2012 DATE OF DISCHARGE:                              OPERATIVE REPORT   PREOPERATIVE DIAGNOSIS:  Chronic right shoulder impingement syndrome with MRI evidence of stage III impingement and small full-thickness rotator cuff tear of anterior supraspinatus with markedly positive rapid abduction sign and Hawkins maneuver.  POSTOPERATIVE DIAGNOSIS:  Very unfavorable anterolateral acromial osteophyte in coracoacromial ligament that was causing an abrasion, full- thickness rotator cuff tear on the bursal side of the supraspinatus.  OPERATION: 1. Diagnostic arthroscopy, right glenohumeral joint with labral     debridement, and limited synovectomy. 2. Subacromial decompression with coracoacromial ligament release and     acromioplasty. 3. Open repair of full-thickness supraspinatus rotator cuff tear     exposing biceps tendon in the anterior greater tuberosity.  OPERATING SURGEON:  Katy Fitch. Mallarie Voorhies, MD  ASSISTANT:  Katelyn Reeks Dasnoit, PA-C  ANESTHESIA:  General by endotracheal technique supplemented by a ropivacaine left plexus block for perioperative comfort.  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  She is extremely small framed.  She is 5 feet 5 inches tall, weighs 100 pounds with a body mass index of 15.  She is very lean.  During the past several years she has had progressive left shoulder pain consistent with impingement and a possible rotator cuff tear.  Her plain films revealed some degenerative change at the [facility] joint consistent with a chronological age, and a small anterolateral acromial hook osteophyte. She was sent for an MRI that was interpreted by  the radiologist to be normal.  However, careful inspection with an orthopedic background revealed that she did indeed have an anterior acromial hook and a defect in her supraspinatus tendon anteriorly.  Katelyn Bradshaw has failed nonoperative measures including extensive therapy, exercise, and rest. We recommended that she proceed with diagnostic arthroscopy anticipating subacromial decompression, and possible distal clavicle resection with repair of rotator cuff as needed.  Preoperatively, she was reminded the potential risks and benefits of surgery.  Questions were invited and answered in detail.  DESCRIPTION OF PROCEDURE:  Katelyn Bradshaw was interviewed in the holding area and her left shoulder marked her proper surgical site identification protocol.  Katelyn Bradshaw provided detailed anesthesia informed consent.  He placed a ropivacaine left shoulder plexus block without complication with ultrasound guidance.  Satisfactory anesthesia of the left arm and forequarter was achieved.  Katelyn Bradshaw was then transferred to room 2 of the [facility] where under Dr. Morley Kos direct supervision general anesthesia by endotracheal technique was induced.  She was carefully positioned in the beach-chair position with aid of torso and head holder designed for shoulder arthroscopy.  A 750 mg of vancomycin was administered slowly over 2 hours as an IV prophylactic antibiotic.  Examination of the shoulder under anesthesia revealed a marked click with rapid abduction and crossed adduction beneath the anterolateral acromion and coracoacromial ligament.  This  correlated with the physical exam findings in the office.  There was no sign of instability, no sign of adhesive capsulitis.  The left upper extremity forequarter was prepped with DuraPrep and draped with impervious arthroscopy drapes.  Following routine surgical time-out, procedure commenced with instrumentation shoulder through an anterior  portal with a switching stick, followed by placing the scope through a standard posterior viewing portal with blunt technique. Diagnostic arthroscopy revealed pristine articular surfaces of the glenoid and humeral head.  The anterior labrum was very diminutive. There was no evidence of a SLAP lesion.  The biceps tendon was normal through the rotator interval and has stout attachment at the superior labrum.  There was some fraying of the capsule and the subscapularis.  A suction shaver was brought in through the anterior portal, used to debride within the joint, complete a limited synovectomy, and to debride some fraying of the labrum.  After photographic documentation of the absence of pathology within the joint, we removed the scope from the glenohumeral joint and placed in the subacromial space.  Diagnostic arthroscopy revealed florid bursitis in subacromial space.  This was debrided with a suction shaver.  Due to Katelyn Bradshaw very small body mass and the absence of any subcutaneous fat and quite diminutive deltoid muscle, it was really quite challenging to position instruments within her subacromial space. We identified the coracoacromial ligament and cleared bursa.  We carefully inspected the rotator cuff from the posterior portal as well as the lateral portal.  She was noted to have a delaminating full- thickness rotator cuff tear, allowing visualization long head of the biceps, and a small portion of greater tuberosity at the anterior supraspinatus.  We palpated the undersurface of the coracoacromial ligament and found a very firm bone spur at its insertion at the anterolateral acromion.  We attempted to remove this arthroscopically, however, due to her diminutive size and challenges on the part of her anesthesia team in maintaining adequate control of her blood pressure, we had challenges visualizing this with excessive bleeding.  Therefore, I elected to perform a small  muscle-splitting incision for hemostasis and direct inspection of the osteophyte and coracoacromial ligament.  The coracoacromial ligament was released and the osteophyte removed with the suction power burr and a hand rasp.  I leveled the acromion to a type 1 morphology and obtained thorough hemostasis.  The subacromial space was carefully palpated and adhesions in the bursa broken with digital dissection.  The defect directly over the anterior greater tuberosity, and long head of the biceps was easily palpated with essentially a donut hole abrasion bursal side full-thickness tear.  We freshened the margins and repaired this side-to-side with mattress sutures of 0 Vicryl.  She had adequate purchase on the greater tuberosity to avoid using an anchor.  The subacromial space was thoroughly lavaged with sterile saline followed by repair of the deltoid split with figure-of-eight sutures of 0 Vicryl.  This was repaired with subcutaneous 2-0 Vicryl and intradermal 3-0 Prolene.  There were no apparent complications.  For aftercare, Ms. Pecha was placed in a sling and will be discharged home with the care of her husband.  We will see her back for followup in our office in 24 hours, at which point in time, we will initiate a therapy program.     Katy Fitch. Darlena Koval, M.D.     RVS/MEDQ  D:  09/27/2012  T:  09/28/2012  Job:  [id number]  cc:   Hal T. Stoneking, M.D.

## 2012-10-03 ENCOUNTER — Encounter (HOSPITAL_BASED_OUTPATIENT_CLINIC_OR_DEPARTMENT_OTHER): Payer: Self-pay

## 2013-09-25 ENCOUNTER — Emergency Department (HOSPITAL_COMMUNITY)
Admission: EM | Admit: 2013-09-25 | Discharge: 2013-09-26 | Disposition: A | Discharge status: Home / Self Care | Payer: Managed Care, Other (non HMO) | Attending: Emergency Medicine | Admitting: Emergency Medicine

## 2013-09-25 ENCOUNTER — Emergency Department (HOSPITAL_COMMUNITY): Payer: Managed Care, Other (non HMO)

## 2013-09-25 ENCOUNTER — Encounter (HOSPITAL_COMMUNITY): Payer: Self-pay | Admitting: Emergency Medicine

## 2013-09-25 DIAGNOSIS — Z8739 Personal history of other diseases of the musculoskeletal system and connective tissue: Secondary | ICD-10-CM | POA: Insufficient documentation

## 2013-09-25 DIAGNOSIS — Z880 Allergy status to penicillin: Secondary | ICD-10-CM | POA: Insufficient documentation

## 2013-09-25 DIAGNOSIS — R000 Tachycardia, unspecified: Secondary | ICD-10-CM | POA: Insufficient documentation

## 2013-09-25 DIAGNOSIS — Z8719 Personal history of other diseases of the digestive system: Secondary | ICD-10-CM | POA: Insufficient documentation

## 2013-09-25 LAB — BASIC METABOLIC PANEL
BUN: 17 mg/dL (ref 6–23)
Chloride: 104 mEq/L (ref 96–112)
Creatinine, Ser: 0.66 mg/dL (ref 0.50–1.10)
GFR calc non Af Amer: >90 mL/min (ref >90)
Glucose, Bld: 88 mg/dL (ref 70–99)
Potassium: 3.9 mEq/L (ref 3.5–5.1)

## 2013-09-25 LAB — CBC WITH DIFFERENTIAL/PLATELET
HCT: 41.9 % (ref 36.0–46.0)
Hemoglobin: 14.2 g/dL (ref 12.0–15.0)
MCHC: 33.9 g/dL (ref 30.0–36.0)
MCV: 94.2 fL (ref 78.0–100.0)

## 2013-09-25 MED ORDER — SODIUM CHLORIDE 0.9 % IV BOLUS (SEPSIS)
1000.0000 mL | Freq: Once | INTRAVENOUS | Status: AC
  Administered 2013-09-25: 1000 mL via INTRAVENOUS

## 2013-09-25 MED ORDER — ASPIRIN 325 MG PO TABS
325.0000 mg | ORAL_TABLET | Freq: Once | ORAL | Status: AC
  Administered 2013-09-25: 325 mg via ORAL
  Filled 2013-09-25: qty 1

## 2013-09-25 NOTE — ED Notes (Signed)
Per EMS< pt reports some palpitations and increased HR starting around 1615 tonight.  Pt went to UC and was noted to have "Anterior Infacrt."  Pt has no complaints of pain or nausea at this time

## 2013-09-25 NOTE — ED Notes (Signed)
Pt states that she is not having any chest pain, but has had some palpitations.  Pt states that she thinks that palpitations feeling is passing, but is not sure.

## 2013-09-26 ENCOUNTER — Emergency Department (HOSPITAL_COMMUNITY): Payer: Managed Care, Other (non HMO)

## 2013-09-26 ENCOUNTER — Encounter (HOSPITAL_COMMUNITY): Payer: Self-pay | Admitting: Radiology

## 2013-09-26 MED ORDER — IOHEXOL 350 MG/ML SOLN
80.0000 mL | Freq: Once | INTRAVENOUS | Status: AC | PRN
  Administered 2013-09-26: 65 mL via INTRAVENOUS

## 2013-09-26 NOTE — ED Provider Notes (Signed)
CSN: [id number]     Arrival date & time 09/25/13  2158 History   First MD Initiated Contact with Patient 09/25/13 2218     Chief Complaint  Patient presents with  . Palpitations   (Consider location/radiation/quality/duration/timing/severity/associated sxs/prior Treatment) Patient is a 62 y.o. female presenting with palpitations. The history is provided by the patient. No language interpreter was used.  Palpitations Palpitations quality:  Regular Onset quality:  Sudden Duration: began at 4:15 while going up stairs and lifting heavy objects. Timing:  Constant Progression:  Improving Chronicity:  New Context: caffeine and exercise   Context: not anxiety, not nicotine and not stimulant use   Relieved by:  Bed rest Worsened by:  Nothing tried Associated symptoms: no back pain, no chest pain, no chest pressure, no cough, no diaphoresis, no dizziness, no leg pain, no lower extremity edema, no nausea, no near-syncope, no shortness of breath and no vomiting   Risk factors: no hx of atrial fibrillation, no hx of DVT, no hx of PE and no hx of thyroid disease     Past Medical History  Diagnosis Date  . Osteoporosis   . Impingement syndrome, shoulder, left 09/2012  . 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 Maxillary bone loss     has lower permanent retainer and bonding material over the retainer  . Dental bridge present     lower   Past Surgical History  Procedure Laterality Date  . Tooth extraction     No family history on file. History  Substance Use Topics  . Smoking status: Never Smoker   . Smokeless tobacco: Never Used  . Alcohol Use: No   OB History   Grav Para Term Preterm Abortions TAB SAB Ect Mult Living   0              Review of Systems  Constitutional: Negative for fever, diaphoresis, activity change and fatigue.  Respiratory: Negative for cough, chest tightness and shortness of breath.   Cardiovascular: Positive for palpitations. Negative for chest pain, leg  swelling and near-syncope.  Gastrointestinal: Negative for nausea, vomiting, abdominal pain and abdominal distention.  Musculoskeletal: Negative for back pain.  Neurological: Negative for dizziness, syncope and light-headedness.  All other systems reviewed and are negative.    Allergies  Doxycycline; Levaquin; Penicillins; Adhesive; and Codeine  Home Medications   Current Outpatient Rx  Name  Route  Sig  Dispense  Refill  . clonazePAM (KLONOPIN) 1 MG tablet   Oral   Take 1 mg by mouth 2 (two) times daily as needed for anxiety.          Marland Kitchen zolpidem (AMBIEN) 10 MG tablet   Oral   Take 1 tablet by mouth at bedtime as needed for sleep.           BP 112/65  Pulse 74  Temp(Src) 98.2 F (36.8 C) (Oral)  Resp 16  SpO2 100% Physical Exam  Vitals reviewed. Constitutional: She is oriented to person, place, and time. She appears well-developed and well-nourished. No distress.  HENT:  Head: Normocephalic.  Mouth/Throat: Oropharynx is clear and moist.  Eyes: Conjunctivae are normal.  Neck: Neck supple. No tracheal deviation present. No thyromegaly present.  Cardiovascular: Regular rhythm, normal heart sounds and intact distal pulses.  Tachycardia present.   Pulmonary/Chest: Effort normal and breath sounds normal.  Abdominal: Soft. Bowel sounds are normal. She exhibits no distension. There is no tenderness.  Musculoskeletal: She exhibits no edema and no tenderness.  Neurological: She is alert and oriented  to person, place, and time.  Skin: Skin is warm and dry.  Psychiatric: She has a normal mood and affect. Her behavior is normal. Judgment and thought content normal.    ED Course  Procedures (including critical care time) Labs Review Labs Reviewed  CBC WITH DIFFERENTIAL  BASIC METABOLIC PANEL  POCT I-STAT TROPONIN I   Imaging Review Ct Angio Chest Pe W/cm &/or Wo Cm  09/26/2013   CLINICAL DATA:  Short of breath and tachycardia  EXAM: CT ANGIOGRAPHY CHEST WITH CONTRAST   TECHNIQUE: Multidetector CT imaging of the chest was performed using the standard protocol during bolus administration of intravenous contrast. Multiplanar CT image reconstructions including MIPs were obtained to evaluate the vascular anatomy.  CONTRAST:  65mL OMNIPAQUE IOHEXOL 350 MG/ML SOLN  COMPARISON:  09/25/2013  FINDINGS: Negative for pulmonary embolism. No significant opacification of the thoracic aorta which does not appear to be dilated.  Mild to moderate pericardial effusion is present.  The lungs are clear without infiltrate or effusion. No mass or adenopathy.  Review of the MIP images confirms the above findings.  IMPRESSION: Negative for pulmonary embolism.  Pericardial effusion.  The lungs are clear.   Electronically Signed   By: Marlan Palau M.D.   On: 09/26/2013 00:42   Dg Chest Portable 1 View  09/25/2013   CLINICAL DATA:  Palpitations and short of breath  EXAM: PORTABLE CHEST - 1 VIEW  COMPARISON:  03/02/2010.  FINDINGS: COPD with hyperinflation of the lungs. Negative for pneumonia or heart failure. No mass or effusion.  IMPRESSION: COPD. No acute cardiopulmonary abnormality.   Electronically Signed   By: Marlan Palau M.D.   On: 09/25/2013 22:51    EKG Interpretation   None      Date: 09/26/2013  Rate: 114  Rhythm: sinus tachycardia  QRS Axis: normal  Intervals: PR prolonged  ST/T Wave abnormalities: ST elevations inferiorly  Conduction Disutrbances:first-degree A-V block   Narrative Interpretation: sinus tachycardia, ST elevation in II,III, AVF, prolonged PR  Old EKG Reviewed: none available    MDM   1. Tachycardia    62 y/o female with no significant PMH presenting with palpitations. Symptoms began at 4:15 while she was lifting and going up and down stairs. No chest pain, SOB, dizziness, n/v. No recent illness. No prior history of palpitations, irregular heart beat, or cardiac disease. No history or risk factors for PE. Seen [facility] with EKG showing ST elevations  concerning for inferior MI. AF, tachycardic on arrival. Regular rhythm. Lungs clear. No JVD, LE edema. No LE swelling, tenderness, erythema. Other vitals stable. Well appearing. Evaluated by Cardiology who did not feel that this was a STEMI. Fluid bolus given with resolution of tachycardia. CT PE obtained which did not show PE. Troponin negative. Repeat EKG showed resolution of ST changes and NSR. Unclear etiology of palpitations. She reports that she has consumed more caffeine than usual and had been working out harder which could explain. Does not appear to be consistent with arrythmia, ACS, PE. Also doubt thyroid disorder, toxic ingestion, sepsis. As symptoms have now resolved, patient appropriate for discharge with PCP f/u. Return precautions discussed and patient voiced understanding of instructions.   Labs and imaging reviewed in my medical decision making if ordered. Patient discussed with my attending, Dr. Micheline Maze.     Abagail Kitchens, MD 09/26/13 2147

## 2013-09-29 NOTE — ED Provider Notes (Signed)
Medical screening examination/treatment/procedure(s) were conducted as a shared visit with resident-physician practitioner(s) and myself.  I personally evaluated the patient during the encounter.  Pt is a 62 y.o. female with pmhx as above presenting with sudden onset palpitations while walking up stairs.  Pt found to have slight STE in inferior leads w/ tachycardia. Cardiology present upon arrival, did not feel this represented acute STEMI, believes the changes are rate dependant and IVF should be given. She denied CP, SOB, lightheadedness, n/v, diaphoresis.  No risk factors for PE.  IVF given, and STE resolved as rate normalized. Pt d/c home w/ return precautions for returning or new symptoms. I agree w/ Dr. Lubertha Basque EKG assessment.    Shanna Cisco, MD 09/29/13 [phone]

## 2013-10-09 ENCOUNTER — Encounter: Payer: Self-pay | Admitting: Interventional Cardiology

## 2013-10-09 DIAGNOSIS — Z972 Presence of dental prosthetic device (complete) (partial): Secondary | ICD-10-CM | POA: Insufficient documentation

## 2013-10-09 DIAGNOSIS — M278 Other specified diseases of jaws: Secondary | ICD-10-CM | POA: Insufficient documentation

## 2013-10-11 ENCOUNTER — Ambulatory Visit (INDEPENDENT_AMBULATORY_CARE_PROVIDER_SITE_OTHER): Payer: Managed Care, Other (non HMO) | Admitting: Interventional Cardiology

## 2013-10-11 ENCOUNTER — Encounter: Payer: Self-pay | Admitting: Interventional Cardiology

## 2013-10-11 VITALS — BP 105/70 | HR 54 | Ht 65.0 in | Wt 95.0 lb

## 2013-10-11 DIAGNOSIS — I4892 Unspecified atrial flutter: Secondary | ICD-10-CM

## 2013-10-11 NOTE — Patient Instructions (Signed)
Your physician recommends that you continue on your current medications as directed. Please refer to the Current Medication list given to you today.  Your physician has requested that you have an echocardiogram. Echocardiography is a painless test that uses sound waves to create images of your heart. It provides your doctor with information about the size and shape of your heart and how well your heart's chambers and valves are working. This procedure takes approximately one hour. There are no restrictions for this procedure.   Your physician has recommended that you wear an event monitor. Event monitors are medical devices that record the heart's electrical activity. Doctors most often us these monitors to diagnose arrhythmias. Arrhythmias are problems with the speed or rhythm of the heartbeat. The monitor is a small, portable device. You can wear one while you do your normal daily activities. This is usually used to diagnose what is causing palpitations/syncope (passing out).  Your physician recommends that you schedule a follow-up appointment in: 4-6 weeks   

## 2013-10-11 NOTE — Progress Notes (Signed)
Patient ID: Katelyn Bradshaw, female   DOB: 1951-03-18, 62 y.o.   MRN: [id number]   Date: 10/11/2013 ID: Katelyn Bradshaw, DOB 25-Dec-1950, MRN [id number] PCP: Ginette Otto, MD  Reason: Atrial flutter  ASSESSMENT;  1. Atrial flutter with 2:1 AV conduction and spontaneous conversion on 09/25/2013. Long-time history of palpitations  PLAN:  1. 2-D Doppler echocardiogram 2. Thirty-day continuous ambulatory monitor 3. 4-6 week followup to discuss data and decide upon a treatment strategy 4. Recommended 81 mg of aspirin daily but she does not feel she could tolerate this from a GI standpoint 5. We had a prolonged conversation concerning the Genesis, and management of atrial flutter. We discussed medical therapy and ablation therapy. We discussed indications for anticoagulation. Her stroke risk is low with a CHADS of less than 1 .   SUBJECTIVE: Katelyn Bradshaw is a 62 y.o. female who is referred by Merlene Laughter for evaluation of tachycardia. The patient was seen in the walk-in clinic followed by a trip to the [facility] Emergency room where an intravenous catheter was started. No specific therapy was given. There was spontaneous conversion to normal sinus rhythm. She has never before had a sustained tachycardia. She feels that there have been palpitations her entire life. During the tachycardia she denied chest pain, dyspnea, lightheadedness, and syncope.   Allergies  Allergen Reactions  . Doxycycline Hives  . Levaquin [Levofloxacin In D5w] Hives  . Penicillins Hives  . Adhesive [Tape] Other (See Comments)    SKIN IRRITATION  . Codeine Nausea Only    Current Outpatient Prescriptions on File Prior to Visit  Medication Sig Dispense Refill  . clonazePAM (KLONOPIN) 1 MG tablet Take 1 mg by mouth 2 (two) times daily as needed for anxiety.       Marland Kitchen zolpidem (AMBIEN) 10 MG tablet Take 1 tablet by mouth at bedtime as needed for sleep.        No current facility-administered medications on  file prior to visit.    Past Medical History  Diagnosis Date  . Osteoporosis   . Impingement syndrome, shoulder, left 09/2012  . 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 Maxillary bone loss     has lower permanent retainer and bonding material over the retainer  . Dental bridge present     lower    Past Surgical History  Procedure Laterality Date  . Tooth extraction      History   Social History  . Marital Status: Married    Spouse Name: N/A    Number of Children: N/A  . Years of Education: N/A   Occupational History  . Not on file.   Social History Main Topics  . Smoking status: Never Smoker   . Smokeless tobacco: Never Used  . Alcohol Use: No  . Drug Use: No  . Sexual Activity: Yes    Birth Control/ Protection: Post-menopausal   Other Topics Concern  . Not on file   Social History Narrative  . No narrative on file    History reviewed. No pertinent family history.  ROS: Weight loss. She denies thyroid disease. Denies gastrointestinal disorder. No dysphagia. She denies neurological diseases and has never had a TIA or migraine.. Other systems negative for complaints.  OBJECTIVE: BP 105/70  Pulse 54  Ht 5\' 5"  (1.651 m)  Wt 95 lb (43.092 kg)  BMI 15.81 kg/m2,  General: No acute distress, slender and near malnourished appearing HEENT: normal with no pallor or jaundice Neck: JVD flat. Carotids absent Chest: Clear Cardiac: Murmur: None. Gallop:  None. Rhythm: Regular. Other: Normal Abdomen: Bruit: None. Pulsation: Pulsatile aorta but not widened Extremities: Edema: None. Pulses: 1+ at all locations Neuro: Normal Psych: Anxious  ECG: Normal with exception of sinus bradycardia and incomplete right bundle

## 2013-10-17 ENCOUNTER — Encounter: Payer: Self-pay | Admitting: *Deleted

## 2013-10-17 ENCOUNTER — Encounter: Payer: Self-pay | Admitting: Cardiology

## 2013-10-17 ENCOUNTER — Ambulatory Visit (HOSPITAL_COMMUNITY): Payer: Managed Care, Other (non HMO) | Attending: Cardiology

## 2013-10-17 ENCOUNTER — Encounter (INDEPENDENT_AMBULATORY_CARE_PROVIDER_SITE_OTHER): Payer: Managed Care, Other (non HMO)

## 2013-10-17 DIAGNOSIS — I079 Rheumatic tricuspid valve disease, unspecified: Secondary | ICD-10-CM | POA: Insufficient documentation

## 2013-10-17 DIAGNOSIS — R002 Palpitations: Secondary | ICD-10-CM | POA: Insufficient documentation

## 2013-10-17 DIAGNOSIS — I319 Disease of pericardium, unspecified: Secondary | ICD-10-CM | POA: Insufficient documentation

## 2013-10-17 DIAGNOSIS — I4892 Unspecified atrial flutter: Secondary | ICD-10-CM

## 2013-10-17 DIAGNOSIS — R000 Tachycardia, unspecified: Secondary | ICD-10-CM | POA: Insufficient documentation

## 2013-10-17 NOTE — Progress Notes (Signed)
Echocardiogram performed.  

## 2013-10-17 NOTE — Progress Notes (Signed)
Patient ID: Katelyn Bradshaw, female   DOB: July 23, 1951, 62 y.o.   MRN: [id number] [employer] 30 day cardiac event monitor applied to patient.

## 2013-10-18 ENCOUNTER — Telehealth: Payer: Self-pay | Admitting: Interventional Cardiology

## 2013-10-18 NOTE — Telephone Encounter (Signed)
Advised that her Echo has not been read by Dr. Katrinka Blazing yet.  He will be back in office on Mon 11/17.  Advised once he has read Echo someone would be calling her the results.

## 2013-10-18 NOTE — Telephone Encounter (Signed)
Follow Up  Pt calling for ECHO results// please call

## 2013-10-21 ENCOUNTER — Telehealth: Payer: Self-pay | Admitting: Interventional Cardiology

## 2013-10-21 DIAGNOSIS — I313 Pericardial effusion (noninflammatory): Secondary | ICD-10-CM

## 2013-10-21 NOTE — Telephone Encounter (Signed)
New message ° ° ° ° ° °Want echo results °

## 2013-10-21 NOTE — Telephone Encounter (Signed)
Message copied by Jarvis Newcomer on Mon Oct 21, 2013  5:40 PM ------      Message from: Verdis Prime      Created: Mon Oct 21, 2013  8:46 AM       Overall, looks pretty good except small pericardial effusion. Uncertain significance but may be playing a role in etiology of effusion. Should repeat limited echo in 1 month to assess for change. ------

## 2013-10-21 NOTE — Telephone Encounter (Signed)
pt given results of echo.Overall, looks pretty good except small pericardial effusion. Uncertain significance but may be playing a role in etiology of effusion. Should repeat limited echo in 1 month to assess for change.adv that scheduling will call to schedule limited echo.pt verbalized understanding

## 2013-11-19 ENCOUNTER — Ambulatory Visit (INDEPENDENT_AMBULATORY_CARE_PROVIDER_SITE_OTHER): Payer: Managed Care, Other (non HMO) | Admitting: Interventional Cardiology

## 2013-11-19 ENCOUNTER — Encounter: Payer: Self-pay | Admitting: Interventional Cardiology

## 2013-11-19 ENCOUNTER — Ambulatory Visit (HOSPITAL_COMMUNITY): Payer: Managed Care, Other (non HMO) | Attending: Interventional Cardiology | Admitting: Radiology

## 2013-11-19 VITALS — BP 120/76 | HR 73 | Ht 65.0 in | Wt 95.0 lb

## 2013-11-19 DIAGNOSIS — I319 Disease of pericardium, unspecified: Secondary | ICD-10-CM

## 2013-11-19 DIAGNOSIS — I313 Pericardial effusion (noninflammatory): Secondary | ICD-10-CM

## 2013-11-19 DIAGNOSIS — I3139 Other pericardial effusion (noninflammatory): Secondary | ICD-10-CM | POA: Insufficient documentation

## 2013-11-19 DIAGNOSIS — I4892 Unspecified atrial flutter: Secondary | ICD-10-CM

## 2013-11-19 NOTE — Patient Instructions (Signed)
Your physician recommends that you continue on your current medications as directed. Please refer to the Current Medication list given to you today.  Your physician wants you to follow-up in: 6 months You will receive a reminder letter in the mail two months in advance. If you don't receive a letter, please call our office to schedule the follow-up appointment.  We will call the the results of your cardiac monitor, and move up your appt if necessary.

## 2013-11-19 NOTE — Progress Notes (Signed)
Limited Echocardiogram performed.  

## 2013-11-19 NOTE — Progress Notes (Signed)
Patient ID: Katelyn Bradshaw, female   DOB: 1951/05/12, 62 y.o.   MRN: [id number]    [address] [place], [place]  [address] Phone: [phone] Fax:  [phone]  Date:  11/19/2013   ID:  Katelyn Bradshaw, DOB Feb 15, 1951, MRN [id number]  PCP:  Ginette Otto, MD   ASSESSMENT:  1. Atrial flutter, with spontaneous conversion and no clinical recurrence over the past 6-7 weeks. 2. Small posterior and RV free wall pericardial effusion with no clinical evidence of change over 6 weeks or evidence of tamponade   PLAN:  1. continue aspirin 81 mg per day 2. Review thirty-day continuous monitor and report results and any changes in treatment strategy 3. Six-month clinical followup   SUBJECTIVE: Katelyn Bradshaw is a 62 y.o. female who has felt premature beats frequently since the last office visit. She wore monitor for 30 days. I don't have the results available. Today's echo was reviewed and the posterior and RV free wall per Baldo Ash effusion is present but has not changed when compared to the prior study. She denies cardiopulmonary complaints other than palpitations.   Wt Readings from Last 3 Encounters:  11/19/13 95 lb (43.092 kg)  10/11/13 95 lb (43.092 kg)  09/27/12 91 lb 2 oz (41.334 kg)     Past Medical History  Diagnosis Date  . Osteoporosis   . Impingement syndrome, shoulder, left 09/2012  . 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 Maxillary bone loss     has lower permanent retainer and bonding material over the retainer  . Dental bridge present     lower    Current Outpatient Prescriptions  Medication Sig Dispense Refill  . aspirin 81 MG tablet Take 81 mg by mouth daily.      . clonazePAM (KLONOPIN) 1 MG tablet Take 1 mg by mouth 2 (two) times daily as needed for anxiety.       Marland Kitchen zolpidem (AMBIEN) 10 MG tablet Take 1 tablet by mouth at bedtime as needed for sleep.        No current facility-administered medications for this visit.    Allergies:      Allergies  Allergen Reactions  . Doxycycline Hives  . Levaquin [Levofloxacin In D5w] Hives  . Penicillins Hives  . Adhesive [Tape] Other (See Comments)    SKIN IRRITATION  . Codeine Nausea Only    Social History:  The patient  reports that she has never smoked. She has never used smokeless tobacco. She reports that she does not drink alcohol or use illicit drugs.   ROS:  Please see the history of present illness.   Denies chills and fever.   All other systems reviewed and negative.   OBJECTIVE: VS:  BP 120/76  Pulse 73  Ht 5\' 5"  (1.651 m)  Wt 95 lb (43.092 kg)  BMI 15.81 kg/m2 Well nourished, well developed, in no acute distress, frail appearing  HEENT: normal Neck: JVD flat. Carotid bruit absent  Cardiac:  normal S1, S2; RRR; no murmur. No paracardial rub is noted  Lungs:  clear to auscultation bilaterally, no wheezing, rhonchi or rales Abd: soft, nontender, no hepatomegaly Ext: Edema absent. Pulses 2+  Skin: warm and dry Neuro:  CNs 2-12 intact, no focal abnormalities noted  EKG:  Not repeated       Signed, Darci Needle III, MD 11/19/2013 10:54 AM

## 2013-11-25 ENCOUNTER — Telehealth: Payer: Self-pay | Admitting: Interventional Cardiology

## 2013-11-25 NOTE — Telephone Encounter (Signed)
pt given results of monitor. 1)fluttering correlates with PAC's 2)No A-Flutter 3)?brief A-Fib with palpitations. no changes at this time pt verbalized understanding.

## 2013-11-25 NOTE — Telephone Encounter (Signed)
New Message  Pt calling for cardiac monitor results..// Please call

## 2014-04-09 DIAGNOSIS — F411 Generalized anxiety disorder: Secondary | ICD-10-CM | POA: Diagnosis not present

## 2014-06-05 DIAGNOSIS — Z124 Encounter for screening for malignant neoplasm of cervix: Secondary | ICD-10-CM | POA: Diagnosis not present

## 2014-06-05 DIAGNOSIS — N390 Urinary tract infection, site not specified: Secondary | ICD-10-CM | POA: Diagnosis not present

## 2014-06-12 ENCOUNTER — Encounter: Payer: Self-pay | Admitting: Interventional Cardiology

## 2014-06-12 ENCOUNTER — Ambulatory Visit (INDEPENDENT_AMBULATORY_CARE_PROVIDER_SITE_OTHER): Payer: Medicare Other | Admitting: Interventional Cardiology

## 2014-06-12 VITALS — BP 102/60 | HR 71 | Ht 65.0 in | Wt 88.0 lb

## 2014-06-12 DIAGNOSIS — I483 Typical atrial flutter: Secondary | ICD-10-CM

## 2014-06-12 DIAGNOSIS — I4892 Unspecified atrial flutter: Secondary | ICD-10-CM | POA: Diagnosis not present

## 2014-06-12 DIAGNOSIS — I319 Disease of pericardium, unspecified: Secondary | ICD-10-CM

## 2014-06-12 DIAGNOSIS — I3139 Other pericardial effusion (noninflammatory): Secondary | ICD-10-CM

## 2014-06-12 DIAGNOSIS — I313 Pericardial effusion (noninflammatory): Secondary | ICD-10-CM

## 2014-06-12 NOTE — Progress Notes (Signed)
Patient ID: Katelyn Bradshaw, female   DOB: 09-Nov-1951, 63 y.o.   MRN: [id number]    [address], [place],   [address] Phone: [phone] Fax:  [phone]  Date:  06/12/2014   ID:  Taleyah Hillman, DOB 1951-05-27, MRN [id number]  PCP:  Mathews Argyle, MD   ASSESSMENT:  1. Ambulatory monitoring demonstrated PACs but no recurrence of atrial flutter or fibrillation. 2. Frequent PACs  PLAN:  1. Clinical observation. Anticoagulation is not indicated. Clinical followup as needed  2. 81 mg of aspirin daily as recommended but the patient refuses.   SUBJECTIVE: Katelyn Bradshaw is a 63 y.o. female no significant symptoms or prolonged arrhythmia in the elbow since being evaluated in January. A continuous ambulatory monitor for 30 days and not refill atrial flutter or fibrillation. She denies neurological complaints. She has not had syncope.   Wt Readings from Last 3 Encounters:  06/12/14 88 lb (39.917 kg)  11/19/13 95 lb (43.092 kg)  10/11/13 95 lb (43.092 kg)     Past Medical History  Diagnosis Date  . Osteoporosis   . Impingement syndrome, shoulder, left 09/2012  . 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 Maxillary bone loss     has lower permanent retainer and bonding material over the retainer  . Dental bridge present     lower    Current Outpatient Prescriptions  Medication Sig Dispense Refill  . clonazePAM (KLONOPIN) 1 MG tablet Take 1 mg by mouth 2 (two) times daily as needed for anxiety.       Marland Kitchen zolpidem (AMBIEN) 10 MG tablet Take 1 tablet by mouth at bedtime as needed for sleep.       Marland Kitchen aspirin 81 MG tablet Take 81 mg by mouth daily.       No current facility-administered medications for this visit.    Allergies:    Allergies  Allergen Reactions  . Doxycycline Hives  . Levaquin [Levofloxacin In D5w] Hives  . Penicillins Hives  . Adhesive [Tape] Other (See Comments)    SKIN IRRITATION  . Codeine Nausea Only    Social History:  The  patient  reports that she has never smoked. She has never used smokeless tobacco. She reports that she does not drink alcohol or use illicit drugs.   ROS:  Please see the history of present illness.   Says aspirin hurts her stomach.   All other systems reviewed and negative.   OBJECTIVE: VS:  BP 102/60  Pulse 71  Ht 5\' 5"  (1.651 m)  Wt 88 lb (39.917 kg)  BMI 14.64 kg/m2  SpO2 98% Well nourished, well developed, in no acute distress, very slender and almost appears undernourished HEENT: normal Neck: JVD flat. Carotid bruit absent  Cardiac:  normal S1, S2; RRR; no murmur Lungs:  clear to auscultation bilaterally, no wheezing, rhonchi or rales Abd: soft, nontender, no hepatomegaly Ext: Edema absent. Pulses 1-2+ bilateral Skin: warm and dry Neuro:  CNs 2-12 intact, no focal abnormalities noted  EKG:  Not repeated       Signed, Illene Labrador III, MD 06/12/2014 10:07 AM

## 2014-06-12 NOTE — Patient Instructions (Signed)
Your physician recommends that you continue on your current medications as directed. Please refer to the Current Medication list given to you today.   Your physician recommends that you schedule a follow-up appointment as needed  

## 2014-08-07 DIAGNOSIS — D485 Neoplasm of uncertain behavior of skin: Secondary | ICD-10-CM | POA: Diagnosis not present

## 2014-08-07 DIAGNOSIS — L03039 Cellulitis of unspecified toe: Secondary | ICD-10-CM | POA: Diagnosis not present

## 2014-08-07 DIAGNOSIS — D2330 Other benign neoplasm of skin of unspecified part of face: Secondary | ICD-10-CM | POA: Diagnosis not present

## 2014-08-07 DIAGNOSIS — D239 Other benign neoplasm of skin, unspecified: Secondary | ICD-10-CM | POA: Diagnosis not present

## 2014-08-07 DIAGNOSIS — D1801 Hemangioma of skin and subcutaneous tissue: Secondary | ICD-10-CM | POA: Diagnosis not present

## 2014-08-07 DIAGNOSIS — L03019 Cellulitis of unspecified finger: Secondary | ICD-10-CM | POA: Diagnosis not present

## 2014-08-26 DIAGNOSIS — R21 Rash and other nonspecific skin eruption: Secondary | ICD-10-CM | POA: Diagnosis not present

## 2014-08-27 DIAGNOSIS — H2510 Age-related nuclear cataract, unspecified eye: Secondary | ICD-10-CM | POA: Diagnosis not present

## 2014-10-27 DIAGNOSIS — M810 Age-related osteoporosis without current pathological fracture: Secondary | ICD-10-CM | POA: Diagnosis not present

## 2014-10-27 DIAGNOSIS — E46 Unspecified protein-calorie malnutrition: Secondary | ICD-10-CM | POA: Diagnosis not present

## 2014-10-27 DIAGNOSIS — K219 Gastro-esophageal reflux disease without esophagitis: Secondary | ICD-10-CM | POA: Diagnosis not present

## 2014-10-27 DIAGNOSIS — Z0000 Encounter for general adult medical examination without abnormal findings: Secondary | ICD-10-CM | POA: Diagnosis not present

## 2014-10-27 DIAGNOSIS — M25519 Pain in unspecified shoulder: Secondary | ICD-10-CM | POA: Diagnosis not present

## 2014-10-27 DIAGNOSIS — Z79899 Other long term (current) drug therapy: Secondary | ICD-10-CM | POA: Diagnosis not present

## 2014-11-05 DIAGNOSIS — Z136 Encounter for screening for cardiovascular disorders: Secondary | ICD-10-CM | POA: Diagnosis not present

## 2014-11-05 DIAGNOSIS — M810 Age-related osteoporosis without current pathological fracture: Secondary | ICD-10-CM | POA: Diagnosis not present

## 2014-11-05 DIAGNOSIS — Z79899 Other long term (current) drug therapy: Secondary | ICD-10-CM | POA: Diagnosis not present

## 2014-11-05 DIAGNOSIS — Z0000 Encounter for general adult medical examination without abnormal findings: Secondary | ICD-10-CM | POA: Diagnosis not present

## 2014-11-15 IMAGING — CT CT ANGIO CHEST
2 of 10 series · 19 of 46 positions shown · IV contrast (APPLIED)
Comparison: 09/25/2013

CLINICAL DATA: Short of breath and tachycardia

EXAM:
CT ANGIOGRAPHY CHEST WITH CONTRAST
TECHNIQUE: Multidetector CT imaging of the chest was performed using the
standard protocol during bolus administration of intravenous
contrast. Multiplanar CT image reconstructions including MIPs were
obtained to evaluate the vascular anatomy.
CONTRAST:  65mL OMNIPAQUE IOHEXOL 350 MG/ML SOLN

[Series 8: thins · axial · 0.51mm/px · z∈[+925,+1229]mm · 16 of 346 slices shown]
[im 21/346  lung]
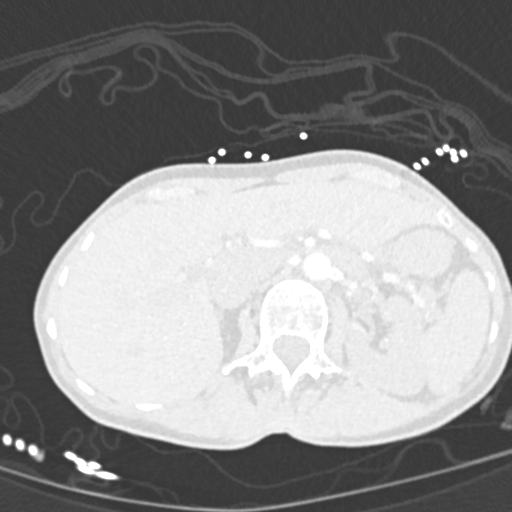
[im 41/346  soft-tissue]
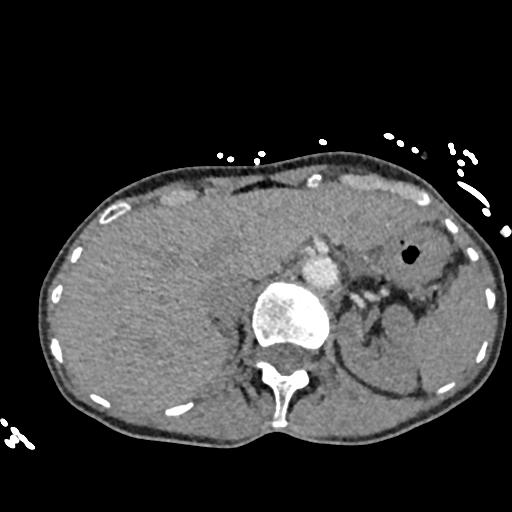
[im 61/346  lung]
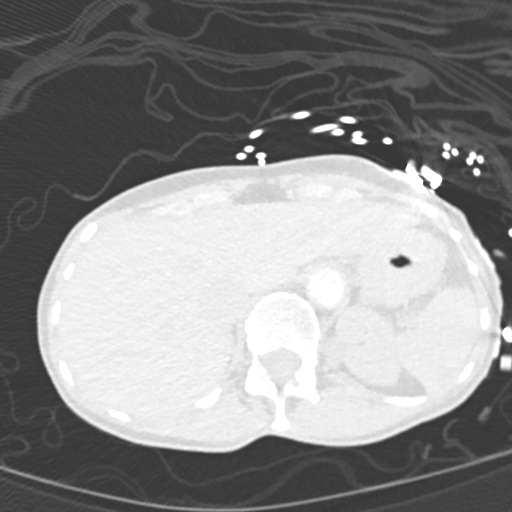
[im 82/346  soft-tissue]
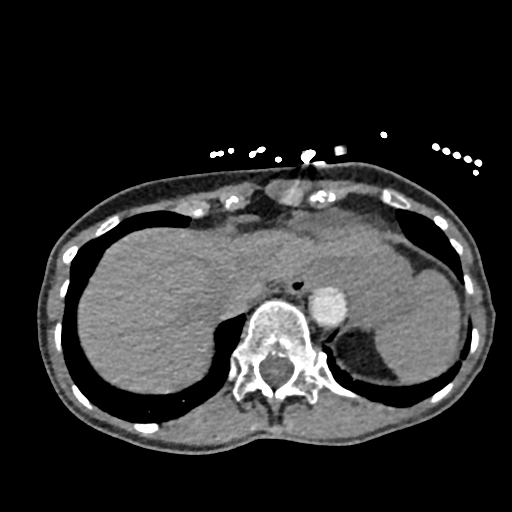
[im 102/346  lung]
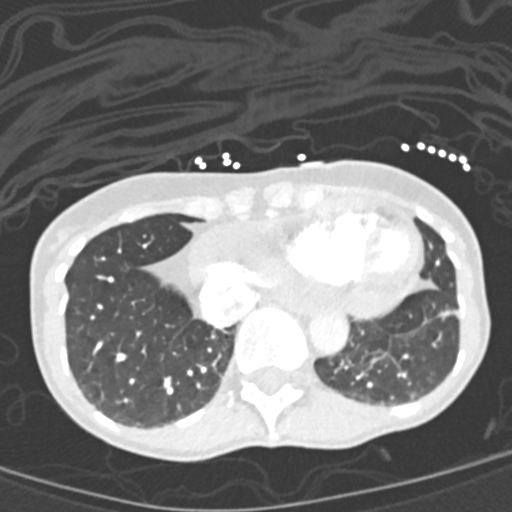
[im 122/346  soft-tissue]
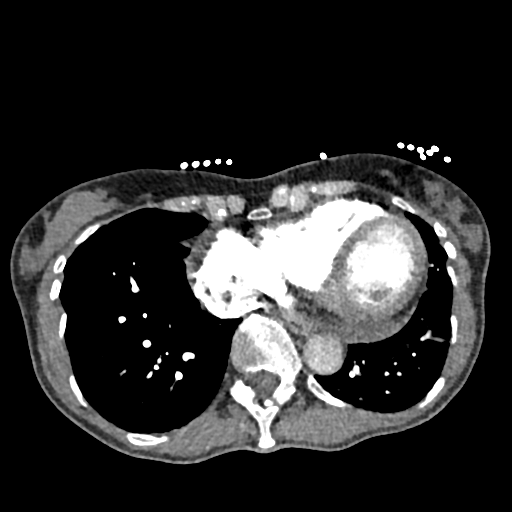
[im 143/346  lung]
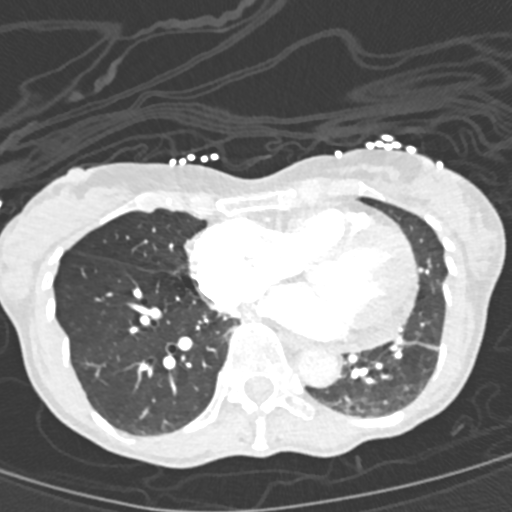
[im 163/346  soft-tissue]
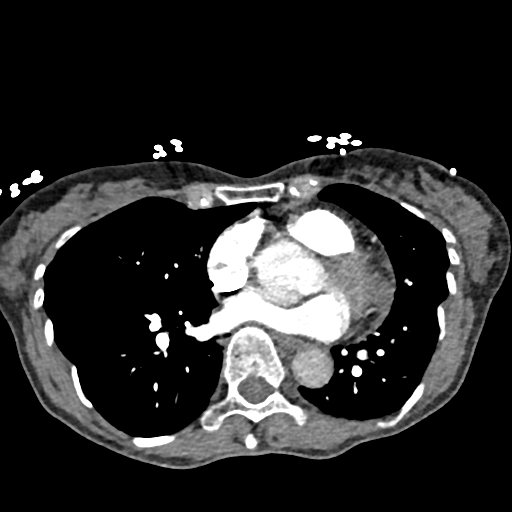
[im 183/346  lung]
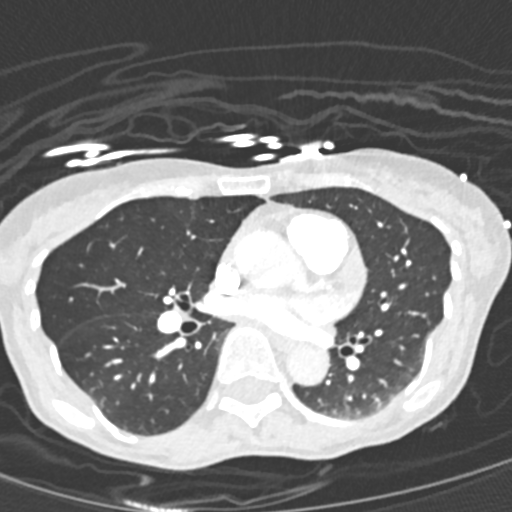
[im 203/346  soft-tissue]
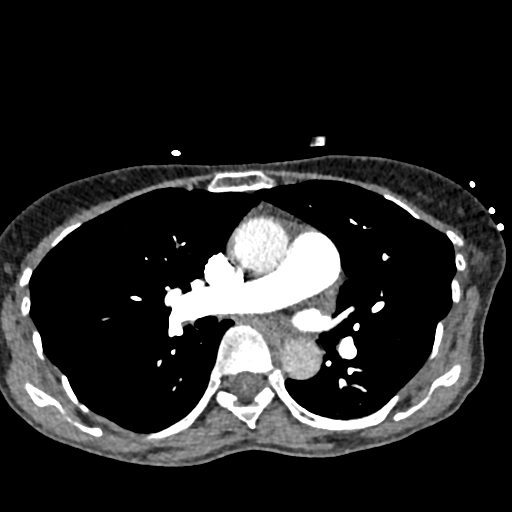
[im 224/346  lung]
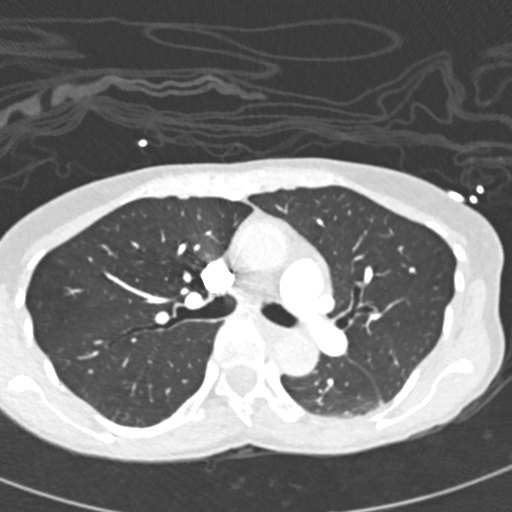
[im 244/346  soft-tissue]
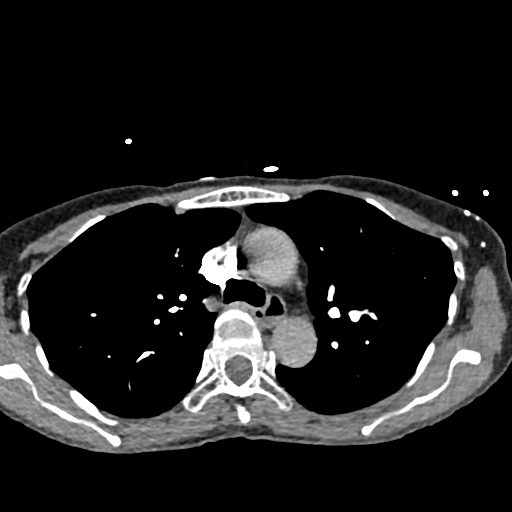
[im 264/346  lung]
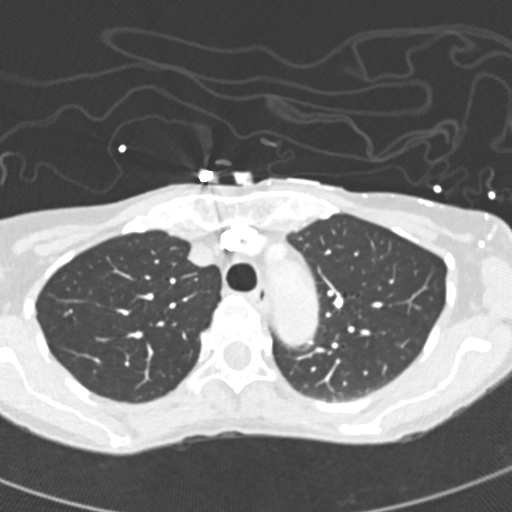
[im 285/346  soft-tissue]
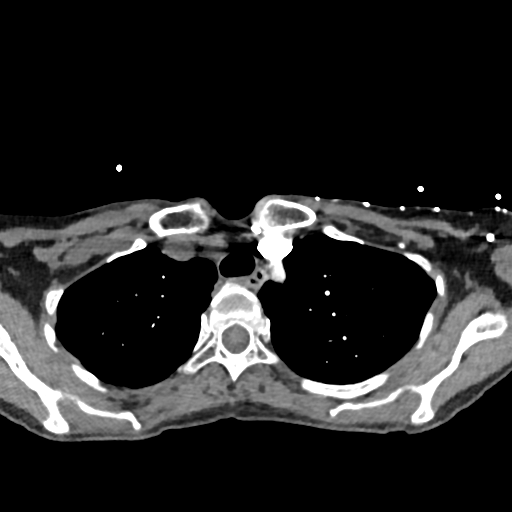
[im 305/346  lung]
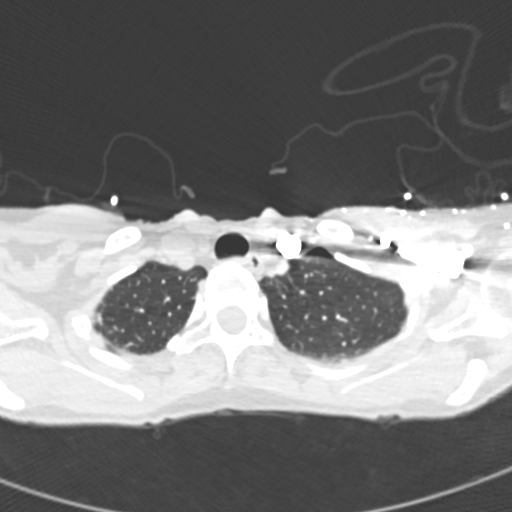
[im 325/346  soft-tissue]
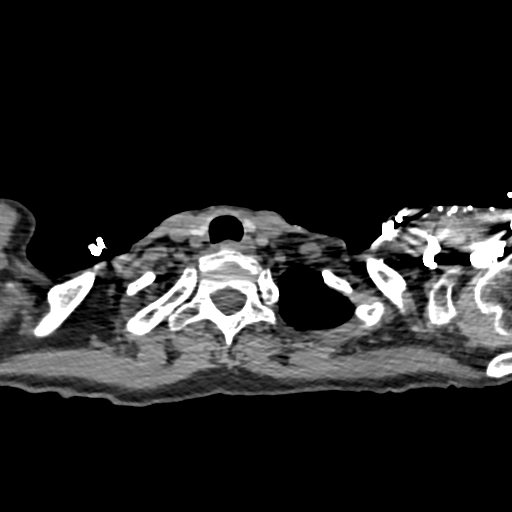

[Series 10: coronal mpr · coronal · 0.68mm/px · 3 of 74 slices shown]
[im 19/74  soft-tissue]
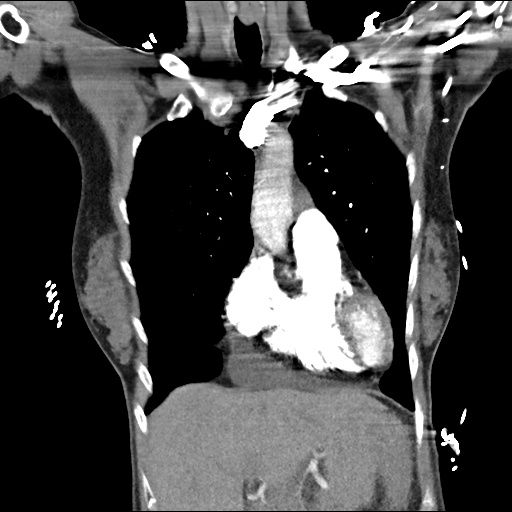
[im 37/74  soft-tissue]
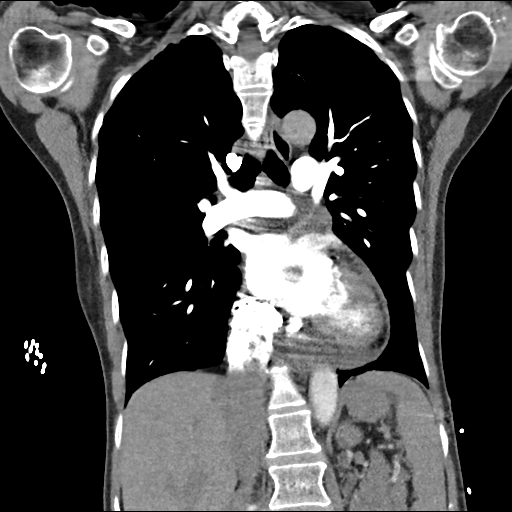
[im 55/74  soft-tissue]
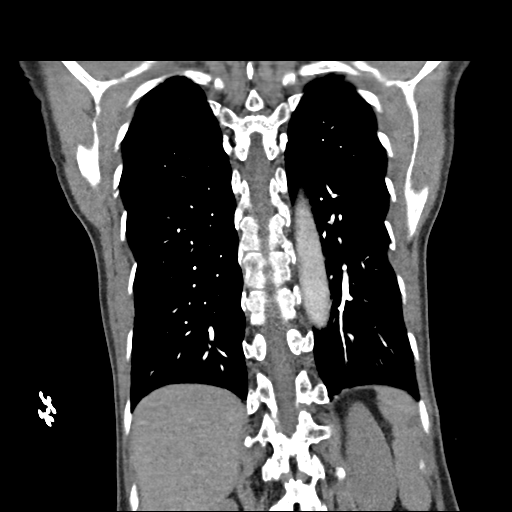

[19 of 46 positions shown; findings below may reference images not displayed]

FINDINGS: Negative for pulmonary embolism. No significant opacification of the
thoracic aorta which does not appear to be dilated.

Mild to moderate pericardial effusion is present.

The lungs are clear without infiltrate or effusion. No mass or
adenopathy.

Review of the MIP images confirms the above findings.
IMPRESSION: Negative for pulmonary embolism.

Pericardial effusion.

The lungs are clear.

## 2015-01-06 DIAGNOSIS — L308 Other specified dermatitis: Secondary | ICD-10-CM | POA: Diagnosis not present

## 2015-01-06 DIAGNOSIS — L820 Inflamed seborrheic keratosis: Secondary | ICD-10-CM | POA: Diagnosis not present

## 2015-01-26 DIAGNOSIS — L209 Atopic dermatitis, unspecified: Secondary | ICD-10-CM | POA: Diagnosis not present

## 2015-05-22 DIAGNOSIS — H04123 Dry eye syndrome of bilateral lacrimal glands: Secondary | ICD-10-CM | POA: Diagnosis not present

## 2015-06-11 DIAGNOSIS — Z681 Body mass index (BMI) 19 or less, adult: Secondary | ICD-10-CM | POA: Diagnosis not present

## 2015-06-11 DIAGNOSIS — Z124 Encounter for screening for malignant neoplasm of cervix: Secondary | ICD-10-CM | POA: Diagnosis not present

## 2015-06-11 DIAGNOSIS — Z01419 Encounter for gynecological examination (general) (routine) without abnormal findings: Secondary | ICD-10-CM | POA: Diagnosis not present

## 2015-06-11 DIAGNOSIS — N951 Menopausal and female climacteric states: Secondary | ICD-10-CM | POA: Diagnosis not present

## 2015-06-11 DIAGNOSIS — Z01411 Encounter for gynecological examination (general) (routine) with abnormal findings: Secondary | ICD-10-CM | POA: Diagnosis not present

## 2015-10-15 DIAGNOSIS — H04123 Dry eye syndrome of bilateral lacrimal glands: Secondary | ICD-10-CM | POA: Diagnosis not present

## 2015-10-16 DIAGNOSIS — L820 Inflamed seborrheic keratosis: Secondary | ICD-10-CM | POA: Diagnosis not present

## 2015-10-16 DIAGNOSIS — D2272 Melanocytic nevi of left lower limb, including hip: Secondary | ICD-10-CM | POA: Diagnosis not present

## 2015-10-16 DIAGNOSIS — D2271 Melanocytic nevi of right lower limb, including hip: Secondary | ICD-10-CM | POA: Diagnosis not present

## 2015-10-16 DIAGNOSIS — L821 Other seborrheic keratosis: Secondary | ICD-10-CM | POA: Diagnosis not present

## 2015-10-16 DIAGNOSIS — D1801 Hemangioma of skin and subcutaneous tissue: Secondary | ICD-10-CM | POA: Diagnosis not present

## 2015-10-16 DIAGNOSIS — L814 Other melanin hyperpigmentation: Secondary | ICD-10-CM | POA: Diagnosis not present

## 2015-10-16 DIAGNOSIS — L308 Other specified dermatitis: Secondary | ICD-10-CM | POA: Diagnosis not present

## 2015-10-16 DIAGNOSIS — D225 Melanocytic nevi of trunk: Secondary | ICD-10-CM | POA: Diagnosis not present

## 2015-10-16 DIAGNOSIS — L738 Other specified follicular disorders: Secondary | ICD-10-CM | POA: Diagnosis not present

## 2015-12-17 DIAGNOSIS — L570 Actinic keratosis: Secondary | ICD-10-CM | POA: Diagnosis not present

## 2015-12-17 DIAGNOSIS — L821 Other seborrheic keratosis: Secondary | ICD-10-CM | POA: Diagnosis not present

## 2015-12-17 DIAGNOSIS — D485 Neoplasm of uncertain behavior of skin: Secondary | ICD-10-CM | POA: Diagnosis not present

## 2015-12-22 DIAGNOSIS — Z0000 Encounter for general adult medical examination without abnormal findings: Secondary | ICD-10-CM | POA: Diagnosis not present

## 2015-12-22 DIAGNOSIS — Z1389 Encounter for screening for other disorder: Secondary | ICD-10-CM | POA: Diagnosis not present

## 2015-12-22 DIAGNOSIS — E46 Unspecified protein-calorie malnutrition: Secondary | ICD-10-CM | POA: Diagnosis not present

## 2015-12-22 DIAGNOSIS — R5383 Other fatigue: Secondary | ICD-10-CM | POA: Diagnosis not present

## 2016-01-06 DIAGNOSIS — L570 Actinic keratosis: Secondary | ICD-10-CM | POA: Diagnosis not present

## 2016-06-10 DIAGNOSIS — R42 Dizziness and giddiness: Secondary | ICD-10-CM | POA: Diagnosis not present

## 2016-06-10 DIAGNOSIS — H8113 Benign paroxysmal vertigo, bilateral: Secondary | ICD-10-CM | POA: Diagnosis not present

## 2016-08-31 DIAGNOSIS — Z01419 Encounter for gynecological examination (general) (routine) without abnormal findings: Secondary | ICD-10-CM | POA: Diagnosis not present

## 2016-08-31 DIAGNOSIS — Z681 Body mass index (BMI) 19 or less, adult: Secondary | ICD-10-CM | POA: Diagnosis not present

## 2016-08-31 DIAGNOSIS — R102 Pelvic and perineal pain: Secondary | ICD-10-CM | POA: Diagnosis not present

## 2016-08-31 DIAGNOSIS — Z124 Encounter for screening for malignant neoplasm of cervix: Secondary | ICD-10-CM | POA: Diagnosis not present

## 2016-09-06 DIAGNOSIS — R0789 Other chest pain: Secondary | ICD-10-CM | POA: Diagnosis not present

## 2016-09-14 DIAGNOSIS — N950 Postmenopausal bleeding: Secondary | ICD-10-CM | POA: Diagnosis not present

## 2016-09-14 DIAGNOSIS — R102 Pelvic and perineal pain: Secondary | ICD-10-CM | POA: Diagnosis not present

## 2016-09-14 DIAGNOSIS — N858 Other specified noninflammatory disorders of uterus: Secondary | ICD-10-CM | POA: Diagnosis not present

## 2016-09-16 DIAGNOSIS — M940 Chondrocostal junction syndrome [Tietze]: Secondary | ICD-10-CM | POA: Diagnosis not present

## 2016-09-22 ENCOUNTER — Other Ambulatory Visit: Payer: Self-pay | Admitting: Obstetrics and Gynecology

## 2016-09-23 NOTE — H&P (Signed)
Katelyn Bradshaw is a 65 y.o. female G: 0  who presents for hysteroscopy with dilatation and curettage because of post menopausal bleeding, pelvic pain and the findings of endometrial masses on ultrasound.  During the patient's recent annual exam, she mentioned pelvic discomfort of several months duration, occurring several days a week. Discomfort was described as feeling like "butterflies" in her belly with twinges that were made worse when she was stressed.  She denied any changes in bowel or bladder function.  She went on to mention that in the past year she had  occasional vaginal spotting that she attributed to her skipping  her hormonal therapy from time to time.  This spotting would only be 1-2 episodes-not lasting a day and would not be accompanied by any cramping or other symptoms. A pelvic ultrasound October 2017 showed: uterus-2.72 x 2.55 x 2.01 cm with endometrium-5.34 mm;  within the uterine cavity there was a color stalk that could represent a polyp or pedunculated fibroid accompanied by a small cystic area;  right ovary-1.5 cm and left ovary-1.43 cm.  Given the patient's menopausal status, symptoms and findings on ultrasound she has agreed to proceed with the recommended hysteroscopy with dilatation and curettage for further evaluation and management.   Past Medical History  OB History: G: 0  GYN History: menarche: 65 YO   LMP: menopausal     The patient denies history of sexually transmitted disease.  Denies history of abnormal PAP smear.  Last PAP smear: 2017  Medical History:  Anxiety, Insomnia, Maxillar Bone Loss, Osteoporosis, Colon Polyps and   Shingles (2014)   Surgical History:  2013  Left Shoulder Surgery for Impingement and Arthrosis Denies problems with anesthesia or history of blood transfusions  Family History:  Heart Disease and Stroke  Social History: Married and Retired;   Denies tobacco or alcohol use  Medications:  Clonazepam 1 mg  bid Zolpidem 10 mg qhs  prn Estriol Vaginal Cream 0.15 mg per vagina twice a week Estradiol 0.2 mg + DHEA 10 mg + Progesterone 20 mg + Testosterone 1 mg/mL Cream daily  Allergies  Allergen Reactions  . Doxycycline Hives  . Levaquin [Levofloxacin In D5w] Hives  . Penicillins Hives    Has patient had a PCN reaction causing immediate rash, facial/tongue/throat swelling, SOB or lightheadedness with hypotension: Yes Has patient had a PCN reaction causing severe rash involving mucus membranes or skin necrosis: no Has patient had a PCN reaction that required hospitalization: no Has patient had a PCN reaction occurring within the last 10 years: no If all of the above answers are "NO", then may proceed with Cephalosporin use.   Marland Kitchen Hydromorphone Nausea Only  . Adhesive [Tape] Other (See Comments)    SKIN IRRITATION  . Codeine Nausea Only       ROS: Admits to some pelvic discomfort,  but denies headache, vision changes, nasal congestion, dysphagia, tinnitus, dizziness, hoarseness, cough,  chest pain, shortness of breath, nausea, vomiting, diarrhea,constipation,  urinary frequency, urgency  dysuria, hematuria, vaginitis symptoms,  swelling of joints,easy bruising,  myalgias, arthralgias, skin rashes, unexplained weight loss and except as is mentioned in the history of present illness, patient's review of systems is otherwise negative.     Physical Exam    Bp: 92/62      P: 78 bpm     R: 16   Temperature: 97.4 degrees F orally    Weight: 91 lbs.  Height: 5' 5.5"  BMI:  Neck: supple without masses or thyromegaly  Lungs: clear to auscultation Heart: regular rate and rhythm Abdomen: soft, non-tender and no organomegaly Pelvic:EGBUS- atrophic ; vagina-atrophic;  uterus-normal size, cervix without lesions or motion tenderness-stenotic; adnexae-no tenderness or masses Extremities:  no clubbing, cyanosis or edema   Assesment: Post menopausal Bleeging                      Endometrial Masses                      Pelvic  Pain   Disposition:  A discussion was held with patient regarding the indication for her procedure(s) along with the risks, which include but are not limited to: reaction to anesthesia, damage to adjacent organs, infection and excessive bleeding.  The patient verbalized understanding of these risks and has consented to proceed with Hysteroscopic Resection of Endometrial Masses,  Dilatation and Curettage at [place] on September 28, 2016.   CSN# [id number]   [place] J. Florene Glen, PA-C  for Dr. Seymour Bars. Rahm Minix

## 2016-09-26 ENCOUNTER — Encounter (HOSPITAL_COMMUNITY): Payer: Self-pay

## 2016-09-26 NOTE — Patient Instructions (Signed)
Your procedure is scheduled on:  Tomorrow, Oct. 25, 2017  Enter through the [employer] [facility] at:  6:00 AM  Pick up the phone at the desk and dial [phone].  Call this number if you have problems the morning of surgery: [phone].  Remember: Do NOT eat food or drink after:  Midnight Tonight  Take these medicines the morning of surgery with a SIP OF WATER:  Clonazepam  Stop ALL herbal medications at this time   Do NOT wear jewelry (body piercing), metal hair clips/bobby pins, make-up, or nail polish. Do NOT wear lotions, powders, or perfumes.  You may wear deodorant. Do NOT shave for 48 hours prior to surgery. Do NOT bring valuables to the hospital. Contacts, dentures, or bridgework may not be worn into surgery.  Have a responsible adult drive you home and stay with you for 24 hours after your procedure

## 2016-09-27 ENCOUNTER — Other Ambulatory Visit: Payer: Self-pay

## 2016-09-27 ENCOUNTER — Encounter (HOSPITAL_COMMUNITY)
Admission: RE | Admit: 2016-09-27 | Discharge: 2016-09-27 | Disposition: A | Discharge status: Home / Self Care | Payer: Medicare Other | Source: Ambulatory Visit | Attending: Obstetrics and Gynecology | Admitting: Obstetrics and Gynecology

## 2016-09-27 ENCOUNTER — Encounter (HOSPITAL_COMMUNITY): Payer: Self-pay

## 2016-09-27 DIAGNOSIS — Z86010 Personal history of colonic polyps: Secondary | ICD-10-CM | POA: Diagnosis not present

## 2016-09-27 DIAGNOSIS — Z881 Allergy status to other antibiotic agents status: Secondary | ICD-10-CM | POA: Diagnosis not present

## 2016-09-27 DIAGNOSIS — Z880 Allergy status to penicillin: Secondary | ICD-10-CM | POA: Diagnosis not present

## 2016-09-27 DIAGNOSIS — M810 Age-related osteoporosis without current pathological fracture: Secondary | ICD-10-CM | POA: Diagnosis not present

## 2016-09-27 DIAGNOSIS — F419 Anxiety disorder, unspecified: Secondary | ICD-10-CM | POA: Diagnosis not present

## 2016-09-27 DIAGNOSIS — Z885 Allergy status to narcotic agent status: Secondary | ICD-10-CM | POA: Diagnosis not present

## 2016-09-27 DIAGNOSIS — K219 Gastro-esophageal reflux disease without esophagitis: Secondary | ICD-10-CM | POA: Diagnosis not present

## 2016-09-27 DIAGNOSIS — Z888 Allergy status to other drugs, medicaments and biological substances status: Secondary | ICD-10-CM | POA: Diagnosis not present

## 2016-09-27 DIAGNOSIS — N840 Polyp of corpus uteri: Secondary | ICD-10-CM | POA: Diagnosis not present

## 2016-09-27 DIAGNOSIS — N950 Postmenopausal bleeding: Secondary | ICD-10-CM | POA: Diagnosis not present

## 2016-09-27 HISTORY — DX: Hypotension, unspecified: I95.9

## 2016-09-27 HISTORY — DX: Gastro-esophageal reflux disease without esophagitis: K21.9

## 2016-09-27 HISTORY — DX: Tachycardia, unspecified: R00.0

## 2016-09-27 LAB — CBC
HEMATOCRIT: 42.5 % (ref 36.0–46.0)
HEMOGLOBIN: 14.5 g/dL (ref 12.0–15.0)
MCH: 31.8 pg (ref 26.0–34.0)
MCHC: 34.1 g/dL (ref 30.0–36.0)
MCV: 93.2 fL (ref 78.0–100.0)
Platelets: 235 10*3/uL (ref 150–400)
RBC: 4.56 MIL/uL (ref 3.87–5.11)
RDW: 13.9 % (ref 11.5–15.5)
WBC: 6.5 10*3/uL (ref 4.0–10.5)

## 2016-09-27 NOTE — Anesthesia Preprocedure Evaluation (Addendum)
Anesthesia Evaluation  Patient identified by MRN, date of birth, ID band Patient awake    Reviewed: Allergy & Precautions, H&P , NPO status , Patient's Chart, lab work & pertinent test results  History of Anesthesia Complications Negative for: history of anesthetic complications  Airway Mallampati: I       Dental  (+) Teeth Intact, Dental Advisory Given   Pulmonary neg pulmonary ROS,    Pulmonary exam normal        Cardiovascular negative cardio ROS Normal cardiovascular exam     Neuro/Psych negative neurological ROS  negative psych ROS   GI/Hepatic Neg liver ROS, GERD  ,  Endo/Other  negative endocrine ROS  Renal/GU negative Renal ROS     Musculoskeletal   Abdominal   Peds  Hematology   Anesthesia Other Findings   Reproductive/Obstetrics                            Anesthesia Physical  Anesthesia Plan  ASA: I  Anesthesia Plan: General   Post-op Pain Management:    Induction:   Airway Management Planned: LMA  Additional Equipment:   Intra-op Plan:   Post-operative Plan: Extubation in OR  Informed Consent: I have reviewed the patients History and Physical, chart, labs and discussed the procedure including the risks, benefits and alternatives for the proposed anesthesia with the patient or authorized representative who has indicated his/her understanding and acceptance.   Dental advisory given  Plan Discussed with: CRNA and Surgeon  Anesthesia Plan Comments:       Anesthesia Quick Evaluation

## 2016-09-27 NOTE — Pre-Procedure Instructions (Signed)
EKG reviewed by Dr. Lissa Hoard, no new orders received at this time.

## 2016-09-28 ENCOUNTER — Encounter (HOSPITAL_COMMUNITY)
Admission: RE | Disposition: A | Discharge status: Home / Self Care | Payer: Self-pay | Source: Ambulatory Visit | Attending: Obstetrics and Gynecology

## 2016-09-28 ENCOUNTER — Ambulatory Visit (HOSPITAL_COMMUNITY): Payer: Medicare Other | Admitting: Anesthesiology

## 2016-09-28 ENCOUNTER — Encounter (HOSPITAL_COMMUNITY): Payer: Self-pay

## 2016-09-28 ENCOUNTER — Ambulatory Visit (HOSPITAL_COMMUNITY)
Admission: RE | Admit: 2016-09-28 | Discharge: 2016-09-28 | Disposition: A | Discharge status: Home / Self Care | Payer: Medicare Other | Source: Ambulatory Visit | Attending: Obstetrics and Gynecology | Admitting: Obstetrics and Gynecology

## 2016-09-28 DIAGNOSIS — Z880 Allergy status to penicillin: Secondary | ICD-10-CM | POA: Insufficient documentation

## 2016-09-28 DIAGNOSIS — Z881 Allergy status to other antibiotic agents status: Secondary | ICD-10-CM | POA: Insufficient documentation

## 2016-09-28 DIAGNOSIS — Z885 Allergy status to narcotic agent status: Secondary | ICD-10-CM | POA: Insufficient documentation

## 2016-09-28 DIAGNOSIS — N950 Postmenopausal bleeding: Secondary | ICD-10-CM | POA: Diagnosis not present

## 2016-09-28 DIAGNOSIS — K219 Gastro-esophageal reflux disease without esophagitis: Secondary | ICD-10-CM | POA: Diagnosis not present

## 2016-09-28 DIAGNOSIS — Z888 Allergy status to other drugs, medicaments and biological substances status: Secondary | ICD-10-CM | POA: Insufficient documentation

## 2016-09-28 DIAGNOSIS — N840 Polyp of corpus uteri: Secondary | ICD-10-CM | POA: Diagnosis not present

## 2016-09-28 DIAGNOSIS — M810 Age-related osteoporosis without current pathological fracture: Secondary | ICD-10-CM | POA: Diagnosis not present

## 2016-09-28 DIAGNOSIS — F419 Anxiety disorder, unspecified: Secondary | ICD-10-CM | POA: Diagnosis not present

## 2016-09-28 DIAGNOSIS — Z86010 Personal history of colonic polyps: Secondary | ICD-10-CM | POA: Diagnosis not present

## 2016-09-28 HISTORY — PX: DILATATION & CURRETTAGE/HYSTEROSCOPY WITH RESECTOCOPE: SHX5572

## 2016-09-28 SURGERY — DILATATION & CURETTAGE/HYSTEROSCOPY WITH RESECTOCOPE
Anesthesia: General

## 2016-09-28 MED ORDER — LIDOCAINE HCL (CARDIAC) 20 MG/ML IV SOLN
INTRAVENOUS | Status: AC
  Filled 2016-09-28: qty 5

## 2016-09-28 MED ORDER — ONDANSETRON HCL 4 MG/2ML IJ SOLN
INTRAMUSCULAR | Status: DC | PRN
  Administered 2016-09-28: 4 mg via INTRAVENOUS

## 2016-09-28 MED ORDER — LIDOCAINE HCL 2 % IJ SOLN
INTRAMUSCULAR | Status: AC
  Filled 2016-09-28: qty 20

## 2016-09-28 MED ORDER — LIDOCAINE HCL 2 % IJ SOLN
INTRAMUSCULAR | Status: DC | PRN
Start: 2016-09-28 — End: 2016-09-28
  Administered 2016-09-28: 10 mL

## 2016-09-28 MED ORDER — LACTATED RINGERS IV SOLN
INTRAVENOUS | Status: DC
  Administered 2016-09-28: 125 mL/h via INTRAVENOUS
  Administered 2016-09-28: 08:00:00 via INTRAVENOUS

## 2016-09-28 MED ORDER — IBUPROFEN 600 MG PO TABS: ORAL_TABLET | ORAL | 0 refills | Status: AC

## 2016-09-28 MED ORDER — FENTANYL CITRATE (PF) 100 MCG/2ML IJ SOLN
INTRAMUSCULAR | Status: AC
  Filled 2016-09-28: qty 2

## 2016-09-28 MED ORDER — KETOROLAC TROMETHAMINE 30 MG/ML IJ SOLN
INTRAMUSCULAR | Status: DC | PRN
  Administered 2016-09-28: 30 mg via INTRAVENOUS
  Administered 2016-09-28: 30 mg via INTRAMUSCULAR

## 2016-09-28 MED ORDER — KETOROLAC TROMETHAMINE 30 MG/ML IJ SOLN
INTRAMUSCULAR | Status: AC
  Filled 2016-09-28: qty 1

## 2016-09-28 MED ORDER — PROPOFOL 10 MG/ML IV BOLUS
INTRAVENOUS | Status: DC | PRN
  Administered 2016-09-28: 200 mg via INTRAVENOUS

## 2016-09-28 MED ORDER — MIDAZOLAM HCL 2 MG/2ML IJ SOLN
INTRAMUSCULAR | Status: AC
  Filled 2016-09-28: qty 2

## 2016-09-28 MED ORDER — ONDANSETRON HCL 4 MG/2ML IJ SOLN
INTRAMUSCULAR | Status: AC
  Filled 2016-09-28: qty 2

## 2016-09-28 MED ORDER — FENTANYL CITRATE (PF) 100 MCG/2ML IJ SOLN: 25.0000 ug | INTRAMUSCULAR | Status: DC | PRN

## 2016-09-28 MED ORDER — DEXAMETHASONE SODIUM PHOSPHATE 4 MG/ML IJ SOLN
INTRAMUSCULAR | Status: DC | PRN
  Administered 2016-09-28: 10 mg via INTRAVENOUS

## 2016-09-28 MED ORDER — FENTANYL CITRATE (PF) 100 MCG/2ML IJ SOLN
INTRAMUSCULAR | Status: DC | PRN
  Administered 2016-09-28: 25 ug via INTRAVENOUS

## 2016-09-28 MED ORDER — MIDAZOLAM HCL 5 MG/5ML IJ SOLN
INTRAMUSCULAR | Status: DC | PRN
  Administered 2016-09-28: 2 mg via INTRAVENOUS

## 2016-09-28 MED ORDER — PROPOFOL 10 MG/ML IV BOLUS
INTRAVENOUS | Status: AC
Start: 2016-09-28 — End: 2016-09-28
  Filled 2016-09-28: qty 20

## 2016-09-28 MED ORDER — PROMETHAZINE HCL 25 MG/ML IJ SOLN: 6.2500 mg | INTRAMUSCULAR | Status: DC | PRN

## 2016-09-28 MED ORDER — DEXAMETHASONE SODIUM PHOSPHATE 10 MG/ML IJ SOLN
INTRAMUSCULAR | Status: AC
  Filled 2016-09-28: qty 1

## 2016-09-28 MED ORDER — LIDOCAINE HCL (CARDIAC) 20 MG/ML IV SOLN
INTRAVENOUS | Status: DC | PRN
  Administered 2016-09-28: 100 mg via INTRAVENOUS

## 2016-09-28 MED ORDER — SODIUM CHLORIDE 0.9 % IR SOLN
Status: DC | PRN
Start: 2016-09-28 — End: 2016-09-28
  Administered 2016-09-28: 3000 mL

## 2016-09-28 SURGICAL SUPPLY — 24 items
BIPOLAR CUTTING LOOP 21FR (ELECTRODE)
BOOTIES KNEE HIGH SLOAN (MISCELLANEOUS) ×6 IMPLANT
CANISTER SUCT 3000ML (MISCELLANEOUS) ×3 IMPLANT
CATH ROBINSON RED A/P 16FR (CATHETERS) ×3 IMPLANT
CLOTH BEACON ORANGE TIMEOUT ST (SAFETY) ×3 IMPLANT
CONTAINER PREFILL 10% NBF 60ML (FORM) ×6 IMPLANT
DEVICE MYOSURE LITE (MISCELLANEOUS) ×3 IMPLANT
DILATOR CANAL MILEX (MISCELLANEOUS) ×3 IMPLANT
ELECT REM PT RETURN 9FT ADLT (ELECTROSURGICAL) ×3
ELECTRODE REM PT RTRN 9FT ADLT (ELECTROSURGICAL) ×1 IMPLANT
GLOVE BIOGEL PI IND STRL 7.0 (GLOVE) ×1 IMPLANT
GLOVE BIOGEL PI INDICATOR 7.0 (GLOVE) ×2
GLOVE SURG SS PI 6.5 STRL IVOR (GLOVE) ×6 IMPLANT
GOWN STRL REUS W/TWL LRG LVL3 (GOWN DISPOSABLE) ×6 IMPLANT
LOOP CUTTING BIPOLAR 21FR (ELECTRODE) IMPLANT
PACK VAGINAL MINOR WOMEN LF (CUSTOM PROCEDURE TRAY) ×3 IMPLANT
PAD OB MATERNITY 4.3X12.25 (PERSONAL CARE ITEMS) ×3 IMPLANT
SEAL ROD LENS SCOPE MYOSURE (ABLATOR) ×3 IMPLANT
SUT VIC AB 2-0 SH 27 (SUTURE) ×2
SUT VIC AB 2-0 SH 27XBRD (SUTURE) ×1 IMPLANT
TOWEL OR 17X24 6PK STRL BLUE (TOWEL DISPOSABLE) ×6 IMPLANT
TUBING AQUILEX INFLOW (TUBING) ×3 IMPLANT
TUBING AQUILEX OUTFLOW (TUBING) ×3 IMPLANT
WATER STERILE IRR 1000ML POUR (IV SOLUTION) ×3 IMPLANT

## 2016-09-28 NOTE — Op Note (Signed)
09/28/2016  8:54 AM  PATIENT:  Katelyn Bradshaw  65 y.o. female  PRE-OPERATIVE DIAGNOSIS:  POST MENOPAUSAL BLEEDING  POST-OPERATIVE DIAGNOSIS:  Endometrial polyp  PROCEDURE:  Procedure(s): DILATATION & CURETTAGE/HYSTEROSCOPY WITH Removal of Endometrial polyp  SURGEON:  Linford Quintela P, MD  ASSISTANTS: none  ANESTHESIA:   general  ESTIMATED BLOOD LOSS: * No blood loss amount entered *   BLOOD ADMINISTERED:none  COMPLICATIONS:none  FINDINGS: 57mm endometrial polyp.  Endometrial lining thin and without other lesions  FLUID DEFICIT:100cc  LOCAL MEDICATIONS USED:  LIDOCAINE  and Amount: 10 ml  SPECIMEN:  Source of Specimen:  endometrial polyp and endometrial currettings  DISPOSITION OF SPECIMEN:  PATHOLOGY  COUNTS:  YES  DESCRIPTION OF PROCEDURE:the patient was taken to the operating room after appropriate identification and placed on the operating table. After the attainment of adequate general anesthesia she was placed in the lithotomy position.  A time out was performed.   The perineum and vagina were prepped with multiple layers of Betadine. The bladder was emptied with a an in and out catheter. The perineum was draped in sterile field. A Grave's  speculum was placed in the vagina. The cervix was grasped with a single-tooth tenaculum. A paracervical block was achieved with a total of 10 cc of 2% Xylocaine and the 5 and 7:00 positions. The uterus was sounded.  The cervix was then dilated to accommodate the diagnostic hysteroscope. The hysteroscope was used to evaluate all quadrants of the uterus with the above noted findings.  Both tubal ostia were seen. An attempt was made to use the Myosure for resection, however that hysteroscope could not be placed through the stenotic os.  A Randal stone forcep was then used to grasp the polyp in 2 pieces and it was removed from the endometrial cavity.  Repeat hysteroscopy documented complete removal of the polyp.  The endometrial  cavity was then curretted in all quadrants, producing minimal tissue.   All instruments were then removed from the vagina and the patient was awakened from general anesthesia and taken to the recovery room in satisfactory condition having tolerated the procedure well sponge and instrument counts correct.  PLAN OF CARE: discharge home after Post anesthesia care.  PATIENT DISPOSITION:  PACU - hemodynamically stable.   Delay start of Pharmacological VTE agent (>24hrs) due to surgical blood loss or risk of bleeding:  Yes.  SCD hose used during the case.   Eldred Manges, MD 8:54 AM

## 2016-09-28 NOTE — Anesthesia Procedure Notes (Signed)
Procedure Name: LMA Insertion Date/Time: 09/28/2016 7:27 AM Performed by: Riki Sheer Pre-anesthesia Checklist: Patient identified, Emergency Drugs available, Suction available, Patient being monitored and Timeout performed Patient Re-evaluated:Patient Re-evaluated prior to inductionOxygen Delivery Method: Circle system utilized Preoxygenation: Pre-oxygenation with 100% oxygen Intubation Type: IV induction Ventilation: Mask ventilation without difficulty LMA: LMA inserted LMA Size: 4.0 Number of attempts: 1 Placement Confirmation: positive ETCO2,  CO2 detector and breath sounds checked- equal and bilateral Tube secured with: Tape Dental Injury: Teeth and Oropharynx as per pre-operative assessment

## 2016-09-28 NOTE — Transfer of Care (Signed)
Immediate Anesthesia Transfer of Care Note  Patient: Katelyn Bradshaw  Procedure(s) Performed: Procedure(s): DILATATION & CURETTAGE/HYSTEROSCOPY WITH Removal of Endometrial polyp (N/A)  Patient Location: PACU  Anesthesia Type:General  Level of Consciousness: awake, alert  and oriented  Airway & Oxygen Therapy: Patient Spontanous Breathing and Patient connected to nasal cannula oxygen  Post-op Assessment: Report given to RN and Post -op Vital signs reviewed and stable  Post vital signs: Reviewed and stable  Last Vitals:  Vitals:   09/28/16 0608  BP: 104/65  Pulse: 71  Resp: 16  Temp: 36.4 C    Last Pain:  Vitals:   09/28/16 0608  TempSrc: Oral      Patients Stated Pain Goal: 3 ([id number])  Complications: No apparent anesthesia complications

## 2016-09-28 NOTE — Discharge Instructions (Signed)

## 2016-09-28 NOTE — Anesthesia Postprocedure Evaluation (Signed)
Anesthesia Post Note  Patient: Katelyn Bradshaw  Procedure(s) Performed: Procedure(s) (LRB): DILATATION & CURETTAGE/HYSTEROSCOPY WITH Removal of Endometrial polyp (N/A)  Patient location during evaluation: PACU Anesthesia Type: General Level of consciousness: sedated Pain management: pain level controlled Vital Signs Assessment: post-procedure vital signs reviewed and stable Respiratory status: spontaneous breathing and respiratory function stable Cardiovascular status: stable Anesthetic complications: no     Last Vitals:  Vitals:   09/28/16 0900 09/28/16 0915  BP: 122/69 117/70  Pulse: (!) 59 (!) 58  Resp: (!) 21 14  Temp:  36.3 C    Last Pain:  Vitals:   09/28/16 0608  TempSrc: Oral   Pain Goal: Patients Stated Pain Goal: 3 (09/28/16 [id number])               Duane Boston DANIEL

## 2016-09-29 ENCOUNTER — Encounter (HOSPITAL_COMMUNITY): Payer: Self-pay | Admitting: Obstetrics and Gynecology

## 2016-10-31 DIAGNOSIS — R102 Pelvic and perineal pain: Secondary | ICD-10-CM | POA: Diagnosis not present

## 2016-11-15 DIAGNOSIS — H2513 Age-related nuclear cataract, bilateral: Secondary | ICD-10-CM | POA: Diagnosis not present

## 2017-01-23 DIAGNOSIS — I4892 Unspecified atrial flutter: Secondary | ICD-10-CM | POA: Diagnosis not present

## 2017-01-23 DIAGNOSIS — F419 Anxiety disorder, unspecified: Secondary | ICD-10-CM | POA: Diagnosis not present

## 2017-01-23 DIAGNOSIS — Z0000 Encounter for general adult medical examination without abnormal findings: Secondary | ICD-10-CM | POA: Diagnosis not present

## 2017-01-23 DIAGNOSIS — Z79899 Other long term (current) drug therapy: Secondary | ICD-10-CM | POA: Diagnosis not present

## 2017-01-23 DIAGNOSIS — M810 Age-related osteoporosis without current pathological fracture: Secondary | ICD-10-CM | POA: Diagnosis not present

## 2017-01-23 DIAGNOSIS — E46 Unspecified protein-calorie malnutrition: Secondary | ICD-10-CM | POA: Diagnosis not present

## 2017-01-23 DIAGNOSIS — Z1389 Encounter for screening for other disorder: Secondary | ICD-10-CM | POA: Diagnosis not present

## 2017-01-24 DIAGNOSIS — I4892 Unspecified atrial flutter: Secondary | ICD-10-CM | POA: Diagnosis not present

## 2017-01-24 DIAGNOSIS — M810 Age-related osteoporosis without current pathological fracture: Secondary | ICD-10-CM | POA: Diagnosis not present

## 2017-01-24 DIAGNOSIS — Z79899 Other long term (current) drug therapy: Secondary | ICD-10-CM | POA: Diagnosis not present

## 2017-01-24 DIAGNOSIS — F411 Generalized anxiety disorder: Secondary | ICD-10-CM | POA: Diagnosis not present

## 2017-02-01 DIAGNOSIS — D2271 Melanocytic nevi of right lower limb, including hip: Secondary | ICD-10-CM | POA: Diagnosis not present

## 2017-02-01 DIAGNOSIS — D1801 Hemangioma of skin and subcutaneous tissue: Secondary | ICD-10-CM | POA: Diagnosis not present

## 2017-02-01 DIAGNOSIS — D225 Melanocytic nevi of trunk: Secondary | ICD-10-CM | POA: Diagnosis not present

## 2017-02-01 DIAGNOSIS — L821 Other seborrheic keratosis: Secondary | ICD-10-CM | POA: Diagnosis not present

## 2017-02-01 DIAGNOSIS — D2272 Melanocytic nevi of left lower limb, including hip: Secondary | ICD-10-CM | POA: Diagnosis not present

## 2017-02-01 DIAGNOSIS — L814 Other melanin hyperpigmentation: Secondary | ICD-10-CM | POA: Diagnosis not present

## 2017-04-24 DIAGNOSIS — I4892 Unspecified atrial flutter: Secondary | ICD-10-CM | POA: Diagnosis not present

## 2017-04-24 DIAGNOSIS — R946 Abnormal results of thyroid function studies: Secondary | ICD-10-CM | POA: Diagnosis not present

## 2017-06-13 DIAGNOSIS — F419 Anxiety disorder, unspecified: Secondary | ICD-10-CM | POA: Diagnosis not present

## 2017-06-13 DIAGNOSIS — K635 Polyp of colon: Secondary | ICD-10-CM | POA: Diagnosis not present

## 2017-06-22 DIAGNOSIS — Z86010 Personal history of colonic polyps: Secondary | ICD-10-CM | POA: Diagnosis not present

## 2017-06-22 DIAGNOSIS — Z8371 Family history of colonic polyps: Secondary | ICD-10-CM | POA: Diagnosis not present

## 2017-06-27 DIAGNOSIS — F419 Anxiety disorder, unspecified: Secondary | ICD-10-CM | POA: Diagnosis not present

## 2017-06-27 DIAGNOSIS — E039 Hypothyroidism, unspecified: Secondary | ICD-10-CM | POA: Diagnosis not present

## 2017-07-04 DIAGNOSIS — Z86010 Personal history of colonic polyps: Secondary | ICD-10-CM | POA: Diagnosis not present

## 2017-07-04 DIAGNOSIS — Z01818 Encounter for other preprocedural examination: Secondary | ICD-10-CM | POA: Diagnosis not present

## 2017-09-01 DIAGNOSIS — Z681 Body mass index (BMI) 19 or less, adult: Secondary | ICD-10-CM | POA: Diagnosis not present

## 2017-09-01 DIAGNOSIS — M810 Age-related osteoporosis without current pathological fracture: Secondary | ICD-10-CM | POA: Diagnosis not present

## 2017-09-01 DIAGNOSIS — N951 Menopausal and female climacteric states: Secondary | ICD-10-CM | POA: Diagnosis not present

## 2017-12-29 DIAGNOSIS — E039 Hypothyroidism, unspecified: Secondary | ICD-10-CM | POA: Diagnosis not present

## 2017-12-29 DIAGNOSIS — F419 Anxiety disorder, unspecified: Secondary | ICD-10-CM | POA: Diagnosis not present

## 2018-01-16 DIAGNOSIS — H2513 Age-related nuclear cataract, bilateral: Secondary | ICD-10-CM | POA: Diagnosis not present

## 2018-03-21 DIAGNOSIS — L570 Actinic keratosis: Secondary | ICD-10-CM | POA: Diagnosis not present

## 2018-03-21 DIAGNOSIS — D220 Melanocytic nevi of lip: Secondary | ICD-10-CM | POA: Diagnosis not present

## 2018-03-21 DIAGNOSIS — L821 Other seborrheic keratosis: Secondary | ICD-10-CM | POA: Diagnosis not present

## 2018-03-21 DIAGNOSIS — L814 Other melanin hyperpigmentation: Secondary | ICD-10-CM | POA: Diagnosis not present

## 2018-03-21 DIAGNOSIS — D2271 Melanocytic nevi of right lower limb, including hip: Secondary | ICD-10-CM | POA: Diagnosis not present

## 2018-03-21 DIAGNOSIS — L918 Other hypertrophic disorders of the skin: Secondary | ICD-10-CM | POA: Diagnosis not present

## 2018-03-21 DIAGNOSIS — D225 Melanocytic nevi of trunk: Secondary | ICD-10-CM | POA: Diagnosis not present

## 2018-03-21 DIAGNOSIS — D1801 Hemangioma of skin and subcutaneous tissue: Secondary | ICD-10-CM | POA: Diagnosis not present

## 2018-03-27 DIAGNOSIS — E039 Hypothyroidism, unspecified: Secondary | ICD-10-CM | POA: Diagnosis not present

## 2018-03-27 DIAGNOSIS — E559 Vitamin D deficiency, unspecified: Secondary | ICD-10-CM | POA: Diagnosis not present

## 2018-03-27 DIAGNOSIS — R002 Palpitations: Secondary | ICD-10-CM | POA: Diagnosis not present

## 2018-03-27 DIAGNOSIS — F419 Anxiety disorder, unspecified: Secondary | ICD-10-CM | POA: Diagnosis not present

## 2018-03-27 DIAGNOSIS — Z131 Encounter for screening for diabetes mellitus: Secondary | ICD-10-CM | POA: Diagnosis not present

## 2018-03-27 DIAGNOSIS — Z0000 Encounter for general adult medical examination without abnormal findings: Secondary | ICD-10-CM | POA: Diagnosis not present

## 2018-03-27 DIAGNOSIS — Z79899 Other long term (current) drug therapy: Secondary | ICD-10-CM | POA: Diagnosis not present

## 2018-03-27 DIAGNOSIS — E7800 Pure hypercholesterolemia, unspecified: Secondary | ICD-10-CM | POA: Diagnosis not present

## 2018-03-28 DIAGNOSIS — R9431 Abnormal electrocardiogram [ECG] [EKG]: Secondary | ICD-10-CM | POA: Diagnosis not present

## 2018-03-28 DIAGNOSIS — R002 Palpitations: Secondary | ICD-10-CM | POA: Diagnosis not present

## 2018-03-28 DIAGNOSIS — I313 Pericardial effusion (noninflammatory): Secondary | ICD-10-CM | POA: Diagnosis not present

## 2018-04-04 DIAGNOSIS — R002 Palpitations: Secondary | ICD-10-CM | POA: Diagnosis not present

## 2018-04-19 DIAGNOSIS — R002 Palpitations: Secondary | ICD-10-CM | POA: Diagnosis not present

## 2018-04-19 DIAGNOSIS — I313 Pericardial effusion (noninflammatory): Secondary | ICD-10-CM | POA: Diagnosis not present

## 2018-05-16 DIAGNOSIS — S0086XA Insect bite (nonvenomous) of other part of head, initial encounter: Secondary | ICD-10-CM | POA: Diagnosis not present

## 2018-09-03 DIAGNOSIS — R102 Pelvic and perineal pain: Secondary | ICD-10-CM | POA: Diagnosis not present

## 2018-09-03 DIAGNOSIS — G4700 Insomnia, unspecified: Secondary | ICD-10-CM | POA: Diagnosis not present

## 2018-09-03 DIAGNOSIS — Z01419 Encounter for gynecological examination (general) (routine) without abnormal findings: Secondary | ICD-10-CM | POA: Diagnosis not present

## 2018-09-03 DIAGNOSIS — Z681 Body mass index (BMI) 19 or less, adult: Secondary | ICD-10-CM | POA: Diagnosis not present

## 2018-09-17 DIAGNOSIS — R102 Pelvic and perineal pain: Secondary | ICD-10-CM | POA: Diagnosis not present

## 2018-09-26 DIAGNOSIS — I313 Pericardial effusion (noninflammatory): Secondary | ICD-10-CM | POA: Diagnosis not present

## 2018-09-26 DIAGNOSIS — R9431 Abnormal electrocardiogram [ECG] [EKG]: Secondary | ICD-10-CM | POA: Diagnosis not present

## 2018-09-27 DIAGNOSIS — R9431 Abnormal electrocardiogram [ECG] [EKG]: Secondary | ICD-10-CM | POA: Diagnosis not present

## 2018-09-27 DIAGNOSIS — R002 Palpitations: Secondary | ICD-10-CM | POA: Diagnosis not present

## 2022-04-06 ENCOUNTER — Emergency Department (HOSPITAL_COMMUNITY): Payer: Medicare Other

## 2022-04-06 ENCOUNTER — Emergency Department (HOSPITAL_COMMUNITY)
Admission: EM | Admit: 2022-04-06 | Discharge: 2022-04-06 | Disposition: A | Discharge status: Home / Self Care | Payer: Medicare Other | Attending: Emergency Medicine | Admitting: Emergency Medicine

## 2022-04-06 DIAGNOSIS — R634 Abnormal weight loss: Secondary | ICD-10-CM | POA: Insufficient documentation

## 2022-04-06 DIAGNOSIS — R630 Anorexia: Secondary | ICD-10-CM | POA: Diagnosis not present

## 2022-04-06 DIAGNOSIS — R5383 Other fatigue: Secondary | ICD-10-CM | POA: Insufficient documentation

## 2022-04-06 DIAGNOSIS — R5381 Other malaise: Secondary | ICD-10-CM | POA: Insufficient documentation

## 2022-04-06 DIAGNOSIS — R110 Nausea: Secondary | ICD-10-CM | POA: Insufficient documentation

## 2022-04-06 DIAGNOSIS — R7401 Elevation of levels of liver transaminase levels: Secondary | ICD-10-CM | POA: Insufficient documentation

## 2022-04-06 DIAGNOSIS — I700 Atherosclerosis of aorta: Secondary | ICD-10-CM | POA: Diagnosis not present

## 2022-04-06 LAB — COMPREHENSIVE METABOLIC PANEL
ALT: 70 U/L — ABNORMAL HIGH (ref 0–44)
AST: 50 U/L — ABNORMAL HIGH (ref 15–41)
Albumin: 4 g/dL (ref 3.5–5.0)
Alkaline Phosphatase: 83 U/L (ref 38–126)
Anion gap: 9 (ref 5–15)
BUN: 27 mg/dL — ABNORMAL HIGH (ref 8–23)
CO2: 26 mmol/L (ref 22–32)
Calcium: 9.8 mg/dL (ref 8.9–10.3)
Chloride: 100 mmol/L (ref 98–111)
Creatinine, Ser: 0.47 mg/dL (ref 0.44–1.00)
GFR, Estimated: >60 mL/min (ref >60)
Glucose, Bld: 114 mg/dL — ABNORMAL HIGH (ref 70–99)
Potassium: 4.5 mmol/L (ref 3.5–5.1)
Sodium: 135 mmol/L (ref 135–145)
Total Bilirubin: 0.4 mg/dL (ref 0.3–1.2)
Total Protein: 7.2 g/dL (ref 6.5–8.1)

## 2022-04-06 LAB — URINALYSIS, ROUTINE W REFLEX MICROSCOPIC
Bilirubin Urine: NEGATIVE
Glucose, UA: NEGATIVE mg/dL
Hgb urine dipstick: NEGATIVE
Ketones, ur: NEGATIVE mg/dL
Leukocytes,Ua: NEGATIVE
Nitrite: NEGATIVE
Protein, ur: NEGATIVE mg/dL
Specific Gravity, Urine: 1.014 (ref 1.005–1.030)
pH: 6 (ref 5.0–8.0)

## 2022-04-06 LAB — CBC WITH DIFFERENTIAL/PLATELET
Abs Immature Granulocytes: 0.02 10*3/uL (ref 0.00–0.07)
Basophils Absolute: 0 10*3/uL (ref 0.0–0.1)
Basophils Relative: 0 %
Eosinophils Absolute: 0.1 10*3/uL (ref 0.0–0.5)
Eosinophils Relative: 1 %
HCT: 44.7 % (ref 36.0–46.0)
Hemoglobin: 15.4 g/dL — ABNORMAL HIGH (ref 12.0–15.0)
Immature Granulocytes: 0 %
Lymphocytes Relative: 12 %
Lymphs Abs: 1 10*3/uL (ref 0.7–4.0)
MCH: 33.7 pg (ref 26.0–34.0)
MCHC: 34.5 g/dL (ref 30.0–36.0)
MCV: 97.8 fL (ref 80.0–100.0)
Monocytes Absolute: 0.7 10*3/uL (ref 0.1–1.0)
Monocytes Relative: 8 %
Neutro Abs: 6.8 10*3/uL (ref 1.7–7.7)
Neutrophils Relative %: 79 %
Platelets: 246 10*3/uL (ref 150–400)
RBC: 4.57 MIL/uL (ref 3.87–5.11)
RDW: 14.4 % (ref 11.5–15.5)
WBC: 8.5 10*3/uL (ref 4.0–10.5)
nRBC: 0 % (ref 0.0–0.2)

## 2022-04-06 LAB — TROPONIN I (HIGH SENSITIVITY): Troponin I (High Sensitivity): 4 ng/L (ref <18)

## 2022-04-06 LAB — TSH: TSH: 1.521 u[IU]/mL (ref 0.350–4.500)

## 2022-04-06 MED ORDER — LACTATED RINGERS IV BOLUS: 1000.0000 mL | Freq: Once | INTRAVENOUS | Status: DC

## 2022-04-06 MED ORDER — ONDANSETRON HCL 4 MG/2ML IJ SOLN
4.0000 mg | Freq: Once | INTRAMUSCULAR | Status: AC
  Administered 2022-04-06: 4 mg via INTRAVENOUS
  Filled 2022-04-06: qty 2

## 2022-04-06 MED ORDER — IOHEXOL 300 MG/ML  SOLN
100.0000 mL | Freq: Once | INTRAMUSCULAR | Status: AC | PRN
  Administered 2022-04-06: 75 mL via INTRAVENOUS

## 2022-04-06 MED ORDER — LACTATED RINGERS IV BOLUS
500.0000 mL | Freq: Once | INTRAVENOUS | Status: AC
Start: 2022-04-06 — End: 2022-04-06
  Administered 2022-04-06: 500 mL via INTRAVENOUS

## 2022-04-06 NOTE — ED Provider Triage Note (Signed)
Emergency Medicine Provider Triage Evaluation Note ? ?Orlinda Blalock , a 71 y.o. female  was evaluated in triage.  Pt complains of weight loss. She has no appetite. She's lost about 10 lbs in 1 month. + nause . No pain or vomiting. Hx of transaminitis. ?See GI Dr. Retta Diones, [place].  Feels very weak ? ?Review of Systems  ?Positive: Weight loss ?Negative: Fever, + night sweats ? ?Physical Exam  ?BP (!) 147/79 (BP Location: Right Arm)   Pulse 82   Temp 97.8 ?F (36.6 ?C) (Oral)   Resp 16   Ht '5\' 5"'$  (1.651 m)   Wt 36.3 kg   SpO2 98%   BMI 13.31 kg/m?  ?Gen:   Awake, no distress   ?Resp:  Normal effort  ?MSK:   Moves extremities without difficulty  ?Other:   Temporal waisting ? ?Medical Decision Making  ?Medically screening exam initiated at 12:14 PM.  Appropriate orders placed.  JERMIYA REICHL was informed that the remainder of the evaluation will be completed by another provider, this initial triage assessment does not replace that evaluation, and the importance of remaining in the ED until their evaluation is complete. ? ?Work inititated ?  ?Margarita Mail, PA-C ?04/06/22 1219 ? ?

## 2022-04-06 NOTE — ED Provider Notes (Signed)
?[facility] DEPT ?Provider Note ? ? ?CSN: [id number] ?Arrival date & time: 04/06/22  1138 ? ?  ? ?History ? ?Chief Complaint  ?Patient presents with  ? Weight Loss  ? ? ?Katelyn Bradshaw is a 71 y.o. female.  Presented to the emergency department due to concern for weight loss.  Patient reports that she has had about 10 pound unintentional weight loss over the past month.  She has had issues with poor appetite, general malaise, fatigue, states that she feels generally unwell and rundown.  At times has some nausea but currently does not have nausea.  No vomiting, no chest pain or abdominal pain.  No blood in stools.  Saw her GI doctor couple days ago and is scheduled for an endoscopy on Tuesday.  Has not seen her primary care doctor yet but has an appointment in a couple weeks. ? ? ? ?HPI ? ?  ? ?Home Medications ?Prior to Admission medications   ?Medication Sig Start Date End Date Taking? Authorizing Provider  ?acetaminophen (TYLENOL) 325 MG tablet Take 650 mg by mouth every 6 (six) hours as needed for moderate pain.    [provider]  ?clonazePAM (KLONOPIN) 1 MG tablet Take 1 mg by mouth 2 (two) times daily as needed for anxiety.  02/23/12   [provider]  ?ibuprofen (ADVIL,MOTRIN) 600 MG tablet Take one '600mg'$  tablet every 6 hours for 3 days.  Then take one '600mg'$  tablet every 6 hours as needed for pain thereafter. 09/28/16   Haygood, Seymour Bars, MD  ?zolpidem (AMBIEN) 10 MG tablet Take 1 tablet by mouth at bedtime as needed for sleep.  02/23/12   [provider]  ?   ? ?Allergies    ?Doxycycline, Levaquin [levofloxacin in d5w], Penicillins, Hydromorphone, Adhesive [tape], and Codeine   ? ?Review of Systems   ?Review of Systems  ?Constitutional:  Positive for activity change, appetite change and fatigue. Negative for chills and fever.  ?HENT:  Negative for ear pain and sore throat.   ?Eyes:  Negative for pain and visual disturbance.  ?Respiratory:  Negative  for cough and shortness of breath.   ?Cardiovascular:  Negative for chest pain and palpitations.  ?Gastrointestinal:  Positive for nausea. Negative for abdominal pain and vomiting.  ?Genitourinary:  Negative for dysuria and hematuria.  ?Musculoskeletal:  Negative for arthralgias and back pain.  ?Skin:  Negative for color change and rash.  ?Neurological:  Positive for weakness. Negative for seizures and syncope.  ?All other systems reviewed and are negative. ? ?Physical Exam ?Updated Vital Signs ?BP (!) 145/82   Pulse 73   Temp 97.8 ?F (36.6 ?C) (Oral)   Resp 13   Ht '5\' 5"'$  (1.651 m)   Wt 36.3 kg   SpO2 97%   BMI 13.31 kg/m?  ?Physical Exam ?Vitals and nursing note reviewed.  ?Constitutional:   ?   General: She is not in acute distress. ?   Appearance: She is well-developed.  ?   Comments: Thin appearing, no distress  ?HENT:  ?   Head: Normocephalic and atraumatic.  ?Eyes:  ?   Conjunctiva/sclera: Conjunctivae normal.  ?Cardiovascular:  ?   Rate and Rhythm: Normal rate and regular rhythm.  ?   Heart sounds: No murmur heard. ?Pulmonary:  ?   Effort: Pulmonary effort is normal. No respiratory distress.  ?   Breath sounds: Normal breath sounds.  ?Abdominal:  ?   Palpations: Abdomen is soft.  ?   Tenderness: There is  no abdominal tenderness.  ?Musculoskeletal:     ?   General: No swelling.  ?   Cervical back: Neck supple.  ?Skin: ?   General: Skin is warm and dry.  ?   Capillary Refill: Capillary refill takes less than 2 seconds.  ?Neurological:  ?   General: No focal deficit present.  ?   Mental Status: She is alert.  ?Psychiatric:     ?   Mood and Affect: Mood normal.  ? ? ?ED Results / Procedures / Treatments   ?Labs ?(all labs ordered are listed, but only abnormal results are displayed) ?Labs Reviewed  ?CBC WITH DIFFERENTIAL/PLATELET - Abnormal; Notable for the following components:  ?    Result Value  ? Hemoglobin 15.4 (*)   ? All other components within normal limits  ?COMPREHENSIVE METABOLIC PANEL -  Abnormal; Notable for the following components:  ? Glucose, Bld 114 (*)   ? BUN 27 (*)   ? AST 50 (*)   ? ALT 70 (*)   ? All other components within normal limits  ?URINALYSIS, ROUTINE W REFLEX MICROSCOPIC  ?TSH  ?I-STAT CHEM 8, ED  ?TROPONIN I (HIGH SENSITIVITY)  ? ? ?EKG ?EKG Interpretation ? ?Date/Time:  Wednesday Apr 06 2022 17:15:55 EDT ?Ventricular Rate:  70 ?PR Interval:  187 ?QRS Duration: 80 ?QT Interval:  405 ?QTC Calculation: 437 ?R Axis:   -12 ?Text Interpretation: Sinus rhythm Left axis deviation RSR' in V1 or V2, probably normal variant Consider anterior infarct no acute STEMI no significant change? when compared to prior ecg in 2017 Confirmed by Madalyn Rob [phone]) on 04/06/2022 5:38:31 PM ? ?Radiology ?CT CHEST ABDOMEN PELVIS W CONTRAST ? ?Result Date: 04/06/2022 ?CLINICAL DATA:  Weight loss EXAM: CT CHEST, ABDOMEN, AND PELVIS WITH CONTRAST TECHNIQUE: Multidetector CT imaging of the chest, abdomen and pelvis was performed following the standard protocol during bolus administration of intravenous contrast. RADIATION DOSE REDUCTION: This exam was performed according to the departmental dose-optimization program which includes automated exposure control, adjustment of the mA and/or kV according to patient size and/or use of iterative reconstruction technique. CONTRAST:  68m OMNIPAQUE IOHEXOL 300 MG/ML  SOLN COMPARISON:  None Available. FINDINGS: CT CHEST FINDINGS Cardiovascular: Normal heart size. Small pericardial effusion. No significant atherosclerotic disease of the thoracic aorta. No suspicious filling defects of the central pulmonary arteries. Mediastinum/Nodes: Esophagus and thyroid are unremarkable. No pathologically enlarged lymph nodes seen in the chest. Lungs/Pleura: Central airways are patent. Mild linear opacities of the left lower lobe and lingula, likely due to scarring or atelectasis. No consolidation, pleural effusion or pneumothorax. Musculoskeletal: No chest wall mass or suspicious  bone lesions identified. CT ABDOMEN PELVIS FINDINGS Hepatobiliary: Low-attenuation lesion of the left hepatic lobe, likely a simple cyst. No suspicious liver lesions. Gallbladder is unremarkable. No biliary ductal dilation. Pancreas: Unremarkable. No pancreatic ductal dilatation or surrounding inflammatory changes. Spleen: Normal in size without focal abnormality. Adrenals/Urinary Tract: Bilateral adrenal glands are unremarkable low-attenuation lesions of the upper pole the left kidney, too small to completely characterize, but likely simple cysts, no further follow-up imaging is recommended these lesions. Bladder is unremarkable. Stomach/Bowel: Stomach is within normal limits. Appendix is not definitely visualized, although there are no secondary findings of acute appendicitis. No evidence of bowel wall thickening, distention, or inflammatory changes. Vascular/Lymphatic: Aortic atherosclerosis. No enlarged abdominal or pelvic lymph nodes. Reproductive: No adnexal masses. Other: No abdominal wall hernia or abnormality. No abdominopelvic ascites. Musculoskeletal: No acute or significant osseous findings. Large sacral Tarlov cyst incidentally  noted. IMPRESSION: 1. No findings in the chest, abdomen or pelvis to explain unintended weight loss. 2. Mild aortic Atherosclerosis (ICD10-I70.0). Electronically Signed   By: Yetta Glassman M.D.   On: 04/06/2022 15:55   ? ?Procedures ?Procedures  ? ? ?Medications Ordered in ED ?Medications  ?iohexol (OMNIPAQUE) 300 MG/ML solution 100 mL (75 mLs Intravenous Contrast Given 04/06/22 1530)  ?ondansetron [facility]) injection 4 mg (4 mg Intravenous Given 04/06/22 1707)  ?lactated ringers bolus 500 mL (0 mLs Intravenous Stopped 04/06/22 1941)  ? ? ?ED Course/ Medical Decision Making/ A&P ?  ?                        ?Medical Decision Making ?Risk ?Prescription drug management. ? ? ?59 lady presented to ER due to concern for weight loss, general malaise, fatigue, poor appetite.  Basic labs were  obtained, noted very slight elevation in AST and ALT but patient reports this is actually improved from prior.  CT chest abdomen pelvis was negative for acute abdominal pelvic pathology or acute chest pathology.  No

## 2022-04-06 NOTE — ED Notes (Signed)
Pt vein started to blow, unable to get I stat or TSH ?

## 2022-04-06 NOTE — Discharge Instructions (Signed)
Please follow-up with your gastroenterologist and your primary care doctor to discuss your ongoing symptoms.  Call both of their offices tomorrow morning.  I recommend keeping the planned endoscopy.  Back to ER if you are having worsening nausea, vomiting, abdominal pain, chest pain, difficulty breathing or other new concerning symptom.  Drink plenty of fluids, continue with the Ensure. ?

## 2022-04-06 NOTE — ED Notes (Signed)
Pt aware urine sample needed 

## 2022-04-06 NOTE — ED Triage Notes (Signed)
Ems brings pt in from home. Family reports pt has lost 20 pounds over the past couple of months. Family reports pt seems to be getting weak. ?

## 2022-04-08 ENCOUNTER — Ambulatory Visit (HOSPITAL_COMMUNITY)
Admission: EM | Admit: 2022-04-08 | Discharge: 2022-04-08 | Disposition: A | Discharge status: Home / Self Care | Payer: Medicare Other | Attending: Psychiatry | Admitting: Psychiatry

## 2022-04-08 DIAGNOSIS — F411 Generalized anxiety disorder: Secondary | ICD-10-CM | POA: Insufficient documentation

## 2022-04-08 NOTE — [facility] Assessment (Signed)
[facility] Assessment Progress Note ?  ?Per Beatriz Stallion, NP, this pt does not require psychiatric hospitalization at this time.  Pt is psychiatrically cleared.  Discharge instructions include referrals for several area outpatient providers.  Otila Kluver has been notified. ? ?Jalene Mullet, MA ?Triage Specialist ?[phone] ? ?

## 2022-04-08 NOTE — ED Provider Notes (Signed)
Behavioral Health Urgent Care Medical Screening Exam ? ?Patient Name: Katelyn Bradshaw ?MRN: [id number] ?Date of Evaluation: 04/08/22 ?Chief Complaint:   ?Diagnosis:  ?Final diagnoses:  ?Generalized anxiety disorder  ? ? ?History of Present illness: Katelyn Bradshaw is a 71 y.o. female. Patient presents voluntarily to [facility] behavioral health for walk-in assessment.  Patient is accompanied by a friend, Denton Ar, who remains present during assessment.  ? ?Clorissa reports she has increased anxiety since January 2023. Symptoms worsening for approximately three months include decreased sleep and appetite, "stress and anxiety." ? ?She has lost approximately 10 pounds in the last three months, believes this is related to ongoing anxiety. She is scheduled for endoscopy next week for dyspepsia and abdominal pain. ? ?Recent stressors include "a breach of sensitive information" that occurred in January 2023.  Patient and her husband spent approximately 2 months updating all of their online account information, she believes that all information is now secure.  ? ?Additional stressors include the death of two friends last year, both were diagnosed with cancer.  ? ?[name] has been diagnosed with anxiety. She is currently prescribed clonazepam by Primary Care Provider, Dr Nancy Fetter. She has refills remaining however she is not scheduled to pick up refill until 04/15/2022.  She will be seen by primary care provider later today.  She is not linked with outpatient psychiatry currently. No history of inpatient psychiatric hospitalization.  She would like to follow-up with outpatient psychiatrist and counselor moving forward. ? ?Patient is assessed face-to-face by nurse practitioner.  She is seated in assessment area, no acute distress.  She is alert and oriented, pleasant and cooperative during assessment.  ?She presents with anxious mood, congruent affect. She denies suicidal and homicidal ideations.  She denies  history of suicide attempts, denies history of non suicidal self-harm behavior.  She easily contracts verbally for safety with this Probation officer. ? ? She has normal speech and behavior.  She denies auditory and visual hallucinations.  Patient is able to converse coherently with goal-directed thoughts and no distractibility or preoccupation.  She denies paranoia.  Objectively there is no evidence of psychosis/mania or delusional thinking. ? ?Katelyn Bradshaw resides in [place] with her husband, she denies access to weapons. She is retired. Patient denies alcohol and substance use.  ? ?Patient offered support and encouragement. ?Patient and friend, Gwinda Passe, verbalize understanding of strict return precautions.  ? ?Patient and friend are educated and verbalize understanding of mental health resources and other crisis services in the community. She is instructed to call 911 and present to the nearest emergency room should she experience any suicidal/homicidal ideation, auditory/visual/hallucinations, or detrimental worsening of her mental health condition.   ? ?Psychiatric Specialty Exam ? ?Presentation  ?General Appearance:Appropriate for Environment; Casual ? ?Eye Contact:Good ? ?Speech:Clear and Coherent; Normal Rate ? ?Speech Volume:Normal ? ?Handedness:Right ? ? ?Mood and Affect  ?Mood:Anxious ? ?Affect:Congruent; Appropriate ? ? ?Thought Process  ?Thought Processes:Coherent; Goal Directed; Linear ? ?Descriptions of Associations:Intact ? ?Orientation:Full (Time, Place and Person) ? ?Thought Content:Logical; WDL ?   Hallucinations:None ? ?Ideas of Reference:None ? ?Suicidal Thoughts:No ? ?Homicidal Thoughts:No ? ? ?Sensorium  ?Memory:Immediate Good; Recent Fair ? ?Judgment:Fair ? ?Insight:Fair ? ? ?Executive Functions  ?Concentration:Good ? ?Attention Span:Good ? ?Recall:Good ? ?Fund of Reader ? ?Language:Good ? ? ?Psychomotor Activity  ?Psychomotor Activity:Normal ? ? ?Assets  ?Assets:Communication Skills; Desire for  Improvement; Financial Resources/Insurance; Housing; Intimacy; Leisure Time; Physical Health; Resilience; Social Support ? ? ?Sleep  ?Sleep:Poor ? ?Number of hours: No data  recorded ? ?No data recorded ? ?Physical Exam: ?Physical Exam ?Vitals and nursing note reviewed.  ?Constitutional:   ?   Appearance: Normal appearance. She is well-developed.  ?HENT:  ?   Head: Normocephalic and atraumatic.  ?   Nose: Nose normal.  ?Cardiovascular:  ?   Rate and Rhythm: Normal rate.  ?Pulmonary:  ?   Effort: Pulmonary effort is normal.  ?Musculoskeletal:     ?   General: Normal range of motion.  ?   Cervical back: Normal range of motion.  ?Skin: ?   General: Skin is warm and dry.  ?Neurological:  ?   Mental Status: She is alert and oriented to person, place, and time.  ?Psychiatric:     ?   Attention and Perception: Attention and perception normal.     ?   Mood and Affect: Affect normal. Mood is anxious.     ?   Speech: Speech normal.     ?   Behavior: Behavior normal. Behavior is cooperative.     ?   Thought Content: Thought content normal.     ?   Cognition and Memory: Cognition and memory normal.  ? ?Review of Systems  ?Constitutional: Negative.   ?HENT: Negative.    ?Eyes: Negative.   ?Respiratory: Negative.    ?Cardiovascular: Negative.   ?Gastrointestinal: Negative.   ?Genitourinary: Negative.   ?Musculoskeletal: Negative.   ?Skin: Negative.   ?Neurological: Negative.   ?Endo/Heme/Allergies: Negative.   ?Psychiatric/Behavioral:  The patient is nervous/anxious.   ?Blood pressure 123/76, pulse 84, temperature 97.8 ?F (36.6 ?C), temperature source Oral, resp. rate 18, SpO2 98 %. There is no height or weight on file to calculate BMI. ? ?Musculoskeletal: ?Strength & Muscle Tone: within normal limits ?Gait & Station: normal ?Patient leans: N/A ? ? ?[facility] MSE Discharge Disposition for Follow up and Recommendations: ?Based on my evaluation the patient does not appear to have an emergency medical condition and can be discharged with  resources and follow up care in outpatient services for Medication Management and Individual Therapy ?Patient reviewed with Dr Serafina Mitchell.  ?Follow up with Primary Care Provider. ?Follow up with outpatient psychiatry, resources provided. ? ?Lucky Rathke, FNP ?04/08/2022, 12:54 PM ? ?

## 2022-04-08 NOTE — Discharge Instructions (Addendum)
For your behavioral health needs you are advised to follow up with outpatient psychiatry and therapy.  Contact the providers listed below at your earliest opportunity to schedule a psychiatry appointment: ? ?     Leanord Hawking, MD ?     [facility] ?     [address] ?     [place], [place] [address] ?     [phone]   ? ?     Norma Fredrickson, MD ?     [facility] ?     [address], [address] ?     [place], [place] [address] ?     [phone] ?     Please note that this office offers both psychiatry and therapy ? ?For therapy contact one of the providers listed below: ? ?     [facility] ?     [address] ?     [place], [place] [address] ?     [phone]  ? ?     [facility] ?     [address] ?     [place], [place] [address] ?     [phone]  ? ?Patient is instructed prior to discharge to: ? Take all medications as prescribed by his/her mental healthcare provider. ?Report any adverse effects and or reactions from the medicines to his/her outpatient provider promptly. ?Keep all scheduled appointments, to ensure that you are getting refills on time and to avoid any interruption in your medication.  If you are unable to keep an appointment call to reschedule.  Be sure to follow-up with resources and follow-up appointments provided.  ?Patient has been instructed & cautioned: To not engage in alcohol and or illegal drug use while on prescription medicines. ?In the event of worsening symptoms, patient is instructed to call the crisis hotline, 911 and or go to the nearest ED for appropriate evaluation and treatment of symptoms. ?To follow-up with his/her primary care provider for your other medical issues, concerns and or health care needs. ?  ? ? ?

## 2022-04-08 NOTE — ED Triage Notes (Signed)
Pt Katelyn Bradshaw presents to [facility] accompanied by her friend. Pt states that she has worsening anxiety and she has been experiencing weight loss, weakness, appetite loss for the past few months. Pt states she was seen in the ED yesterday and has an appointment to see a psychiatrist in a few weeks but she wanted to see someone sooner. Pt was hoping to get medication management.Pt denies SI/HI and AVH. ?

## 2022-04-23 ENCOUNTER — Emergency Department (HOSPITAL_COMMUNITY): Payer: Medicare Other

## 2022-04-23 ENCOUNTER — Emergency Department (HOSPITAL_COMMUNITY)
Admission: EM | Admit: 2022-04-23 | Discharge: 2022-04-24 | Disposition: A | Discharge status: Home / Self Care | Payer: Medicare Other | Attending: Emergency Medicine | Admitting: Emergency Medicine

## 2022-04-23 DIAGNOSIS — R079 Chest pain, unspecified: Secondary | ICD-10-CM | POA: Diagnosis not present

## 2022-04-23 LAB — CBC
HCT: 41.5 % (ref 36.0–46.0)
Hemoglobin: 13.8 g/dL (ref 12.0–15.0)
MCH: 32.9 pg (ref 26.0–34.0)
MCHC: 33.3 g/dL (ref 30.0–36.0)
MCV: 98.8 fL (ref 80.0–100.0)
Platelets: 200 10*3/uL (ref 150–400)
RBC: 4.2 MIL/uL (ref 3.87–5.11)
RDW: 13.9 % (ref 11.5–15.5)
WBC: 6.1 10*3/uL (ref 4.0–10.5)
nRBC: 0 % (ref 0.0–0.2)

## 2022-04-23 LAB — TROPONIN I (HIGH SENSITIVITY): Troponin I (High Sensitivity): 5 ng/L (ref <18)

## 2022-04-23 LAB — BASIC METABOLIC PANEL
Anion gap: 9 (ref 5–15)
BUN: 17 mg/dL (ref 8–23)
CO2: 26 mmol/L (ref 22–32)
Calcium: 9.3 mg/dL (ref 8.9–10.3)
Chloride: 101 mmol/L (ref 98–111)
Creatinine, Ser: 0.66 mg/dL (ref 0.44–1.00)
GFR, Estimated: >60 mL/min (ref >60)
Glucose, Bld: 100 mg/dL — ABNORMAL HIGH (ref 70–99)
Potassium: 3.8 mmol/L (ref 3.5–5.1)
Sodium: 136 mmol/L (ref 135–145)

## 2022-04-23 NOTE — ED Triage Notes (Signed)
Pt via [facility] for eval of mid-central stabbing CP onset this AM  No ASA r/t gastritis, no NTG due to brief episodes of immeditely resolving CP

## 2022-04-24 DIAGNOSIS — R079 Chest pain, unspecified: Secondary | ICD-10-CM | POA: Diagnosis not present

## 2022-04-24 LAB — LIPASE, BLOOD: Lipase: 41 U/L (ref 11–51)

## 2022-04-24 LAB — TROPONIN I (HIGH SENSITIVITY): Troponin I (High Sensitivity): 5 ng/L (ref <18)

## 2022-04-24 NOTE — ED Provider Notes (Signed)
[facility] EMERGENCY DEPARTMENT Provider Note   CSN: [id number] Arrival date & time: 04/23/22  2228     History  Chief Complaint  Patient presents with   Chest Pain    Katelyn Bradshaw is a 71 y.o. female.  71 yo F here with chest pain. Has h/o indigestion and in fact had signicant episodes of indigestion earlier in the morning. These had totally resolved. Started having sharp pain lasting around a second throughout the afternoon. Asymptomatic now. No associated symptoms such as nausea, vomiting, sob, sweating or light headedness.    Chest Pain     Home Medications Prior to Admission medications   Medication Sig Start Date End Date Taking? Authorizing Provider  acetaminophen (TYLENOL) 325 MG tablet Take 650 mg by mouth every 6 (six) hours as needed for moderate pain.    [provider]  clonazePAM (KLONOPIN) 1 MG tablet Take 1 mg by mouth 2 (two) times daily as needed for anxiety.  02/23/12   [provider]  ibuprofen (ADVIL,MOTRIN) 600 MG tablet Take one '600mg'$  tablet every 6 hours for 3 days.  Then take one '600mg'$  tablet every 6 hours as needed for pain thereafter. 09/28/16   Haygood, Seymour Bars, MD  zolpidem (AMBIEN) 10 MG tablet Take 1 tablet by mouth at bedtime as needed for sleep.  02/23/12   [provider]      Allergies    Doxycycline, Levaquin [levofloxacin in d5w], Penicillins, Hydromorphone, Adhesive [tape], and Codeine    Review of Systems   Review of Systems  Cardiovascular:  Positive for chest pain.   Physical Exam Updated Vital Signs BP 133/79   Pulse (!) 138   Temp 98.2 F (36.8 C)   Resp 12   SpO2 98%  Physical Exam Vitals and nursing note reviewed.  Constitutional:      Appearance: She is well-developed.  HENT:     Head: Normocephalic and atraumatic.  Cardiovascular:     Rate and Rhythm: Normal rate and regular rhythm.  Pulmonary:     Effort: No respiratory distress.     Breath sounds: No stridor.   Chest:     Chest wall: No mass, deformity or tenderness.  Abdominal:     General: There is no distension.     Palpations: Abdomen is soft.  Musculoskeletal:        General: Normal range of motion.     Cervical back: Normal range of motion.     Right lower leg: No edema.     Left lower leg: No edema.  Skin:    General: Skin is warm and dry.  Neurological:     Mental Status: She is alert.    ED Results / Procedures / Treatments   Labs (all labs ordered are listed, but only abnormal results are displayed) Labs Reviewed  BASIC METABOLIC PANEL - Abnormal; Notable for the following components:      Result Value   Glucose, Bld 100 (*)    All other components within normal limits  CBC  LIPASE, BLOOD  TROPONIN I (HIGH SENSITIVITY)  TROPONIN I (HIGH SENSITIVITY)    EKG None  Radiology DG Chest 2 View  Result Date: 04/23/2022 CLINICAL DATA:  Chest pain. EXAM: CHEST - 2 VIEW COMPARISON:  Chest CT 04/06/2021 FINDINGS: The heart is normal in size. Normal mediastinal contours. No focal airspace disease, pleural effusion, pneumothorax or pulmonary edema. Minor linear scarring at the left lung base. No acute osseous abnormalities. IMPRESSION: No acute  chest findings. Electronically Signed   By: Keith Rake M.D.   On: 04/23/2022 22:59    Procedures Procedures    Medications Ordered in ED Medications - No data to display  ED Course/ Medical Decision Making/ A&P                           Medical Decision Making Amount and/or Complexity of Data Reviewed Labs: ordered. Radiology: ordered.   Low suspicion for ACS with negative ecg and troponins with a normal CXR interpreted by me. Possibly stress v GI v MSK cause. Will fu w/ PCP For further workup.    Final Clinical Impression(s) / ED Diagnoses Final diagnoses:  Nonspecific chest pain    Rx / DC Orders ED Discharge Orders     None         Kadin Canipe, Corene Cornea, MD 04/24/22 [phone]

## 2022-04-27 ENCOUNTER — Telehealth (HOSPITAL_COMMUNITY): Payer: Self-pay

## 2022-04-27 NOTE — BH Assessment (Signed)
Care Management - [place] Follow Up Discharges   Writer attempted to make contact with patient today and was unsuccessful.  Writer left a HIPPA compliant voice message.   Per chart review, patient will follow up with her established PCP that prescribes her psychiatric medication.
